# Patient Record
Sex: Female | Born: 1935 | Race: Black or African American | Hispanic: No | Smoking: Former smoker
Health system: Southern US, Community
[De-identification: ages and names within clinical notes are randomized; demographics above are authoritative.]

## PROBLEM LIST (undated history)

## (undated) DIAGNOSIS — I10 Essential (primary) hypertension: Secondary | ICD-10-CM

## (undated) DIAGNOSIS — M1990 Unspecified osteoarthritis, unspecified site: Secondary | ICD-10-CM

## (undated) DIAGNOSIS — J40 Bronchitis, not specified as acute or chronic: Secondary | ICD-10-CM

## (undated) DIAGNOSIS — E785 Hyperlipidemia, unspecified: Secondary | ICD-10-CM

## (undated) HISTORY — PX: CHOLECYSTECTOMY: SHX55

## (undated) HISTORY — PX: TOTAL KNEE ARTHROPLASTY: SHX125

---

## 1998-04-27 ENCOUNTER — Emergency Department (HOSPITAL_COMMUNITY): Admission: EM | Admit: 1998-04-27 | Discharge: 1998-04-27 | Payer: Self-pay | Admitting: Emergency Medicine

## 1998-05-11 ENCOUNTER — Other Ambulatory Visit: Admission: RE | Admit: 1998-05-11 | Discharge: 1998-05-11 | Payer: Self-pay | Admitting: Family Medicine

## 1999-04-07 ENCOUNTER — Other Ambulatory Visit: Admission: RE | Admit: 1999-04-07 | Discharge: 1999-04-07 | Payer: Self-pay | Admitting: Endocrinology

## 2000-11-24 ENCOUNTER — Encounter: Payer: Self-pay | Admitting: Emergency Medicine

## 2000-11-24 ENCOUNTER — Emergency Department (HOSPITAL_COMMUNITY): Admission: EM | Admit: 2000-11-24 | Discharge: 2000-11-24 | Payer: Self-pay | Admitting: Emergency Medicine

## 2000-11-26 ENCOUNTER — Emergency Department (HOSPITAL_COMMUNITY): Admission: EM | Admit: 2000-11-26 | Discharge: 2000-11-26 | Payer: Self-pay | Admitting: Emergency Medicine

## 2001-01-31 ENCOUNTER — Emergency Department (HOSPITAL_COMMUNITY): Admission: EM | Admit: 2001-01-31 | Discharge: 2001-01-31 | Payer: Self-pay | Admitting: Emergency Medicine

## 2001-01-31 ENCOUNTER — Encounter: Payer: Self-pay | Admitting: Emergency Medicine

## 2001-06-28 ENCOUNTER — Emergency Department (HOSPITAL_COMMUNITY): Admission: EM | Admit: 2001-06-28 | Discharge: 2001-06-29 | Payer: Self-pay | Admitting: Emergency Medicine

## 2001-06-29 ENCOUNTER — Encounter: Payer: Self-pay | Admitting: Emergency Medicine

## 2002-06-06 ENCOUNTER — Emergency Department (HOSPITAL_COMMUNITY): Admission: EM | Admit: 2002-06-06 | Discharge: 2002-06-06 | Payer: Self-pay | Admitting: Emergency Medicine

## 2002-06-06 ENCOUNTER — Encounter: Payer: Self-pay | Admitting: Emergency Medicine

## 2002-07-04 ENCOUNTER — Encounter: Payer: Self-pay | Admitting: General Surgery

## 2002-07-04 ENCOUNTER — Encounter (INDEPENDENT_AMBULATORY_CARE_PROVIDER_SITE_OTHER): Payer: Self-pay | Admitting: Specialist

## 2002-07-04 ENCOUNTER — Inpatient Hospital Stay (HOSPITAL_COMMUNITY): Admission: RE | Admit: 2002-07-04 | Discharge: 2002-07-06 | Payer: Self-pay | Admitting: General Surgery

## 2002-07-05 ENCOUNTER — Encounter: Payer: Self-pay | Admitting: *Deleted

## 2004-01-02 ENCOUNTER — Emergency Department (HOSPITAL_COMMUNITY): Admission: EM | Admit: 2004-01-02 | Discharge: 2004-01-03 | Payer: Self-pay | Admitting: Emergency Medicine

## 2004-11-22 ENCOUNTER — Ambulatory Visit (HOSPITAL_COMMUNITY): Admission: RE | Admit: 2004-11-22 | Discharge: 2004-11-22 | Payer: Self-pay | Admitting: Endocrinology

## 2006-06-05 ENCOUNTER — Encounter: Payer: Self-pay | Admitting: Vascular Surgery

## 2006-06-05 ENCOUNTER — Ambulatory Visit: Admission: RE | Admit: 2006-06-05 | Discharge: 2006-06-05 | Payer: Self-pay | Admitting: *Deleted

## 2006-06-26 ENCOUNTER — Encounter (INDEPENDENT_AMBULATORY_CARE_PROVIDER_SITE_OTHER): Payer: Self-pay | Admitting: *Deleted

## 2006-06-26 ENCOUNTER — Ambulatory Visit (HOSPITAL_COMMUNITY): Admission: RE | Admit: 2006-06-26 | Discharge: 2006-06-26 | Payer: Self-pay | Admitting: *Deleted

## 2008-09-13 ENCOUNTER — Emergency Department (HOSPITAL_COMMUNITY): Admission: EM | Admit: 2008-09-13 | Discharge: 2008-09-13 | Payer: Self-pay | Admitting: Emergency Medicine

## 2010-12-19 ENCOUNTER — Inpatient Hospital Stay (INDEPENDENT_AMBULATORY_CARE_PROVIDER_SITE_OTHER)
Admission: RE | Admit: 2010-12-19 | Discharge: 2010-12-19 | Disposition: A | Discharge status: Home / Self Care | Payer: Medicaid Other | Source: Ambulatory Visit | Attending: Family Medicine | Admitting: Family Medicine

## 2010-12-19 DIAGNOSIS — L0201 Cutaneous abscess of face: Secondary | ICD-10-CM

## 2010-12-19 DIAGNOSIS — K047 Periapical abscess without sinus: Secondary | ICD-10-CM

## 2011-03-17 NOTE — Discharge Summary (Signed)
   NAME:  Loretta Hartman, Loretta Hartman                         ACCOUNT NO.:  [id number]   MEDICAL RECORD NO.:  [id number]                   PATIENT TYPE:  INP   LOCATION:  0381                                 FACILITY:  [facility]   PHYSICIAN:  Adolph Pollack, M.D.            DATE OF BIRTH:  1932-04-08   DATE OF ADMISSION:  07/04/2002  DATE OF DISCHARGE:  07/06/2002                                 DISCHARGE SUMMARY   PRINCIPAL DISCHARGE DIAGNOSIS:  Chronic calculus cholecystitis.   SECONDARY DIAGNOSES:  1. Choledocholithiasis.  2. Hypertension.  3. Gastroesophageal reflux.  4. Hypercholesterolemia.  5. Arthritis.   PROCEDURE:  1. Laparoscopic cholecystectomy, intraoperative cholangiogram, and     transcystic balloon common bile duct exploration July 04, 2002.  2. Endoscopic retrograde cholangiopancreatography on July 05, 2002.   REASON FOR ADMISSION:  The patient is a 77 year old female with a biliary  colic seen in the emergency department and noted to have gallstones on an  ultrasound.  Because of her symptomatic gallstones, she is scheduled for an  elective cholecystectomy with cholangiogram as her liver functions are  slightly elevated.   HOSPITAL COURSE:  She underwent the laparoscopic cholecystectomy with  cholangiogram and is noted to have common bile duct obstruction and multiple  small stones were evacuated, but following that still could not get the  common bile duct to drain contrast into the duodenum.  Gastroenterology  consultation was obtained with Dr. Luther Parody.  He performed an ERCP.  __________ sphincterotomy and had minimal stone extractions done.  Post  procedure cholangiogram appeared free of defects.  She had initially bumped  her liver function tests right after operation but they improved after ERCP  and on July 06, 2002 she was felt to be adequate for discharge.    DISPOSITION:  Discharged to home July 06, 2002.  She will follow up with  me in two  weeks and was given discharge instructions and Vicodin for pain.  She is told to take all of her home medications.  She is discharged in  satisfactory condition.                                               Adolph Pollack, M.D.    Kari Baars  D:  07/15/2002  T:  07/15/2002  Job:  [id number]   cc:   Althea Grimmer. Luther Parody, M.D.  [address], [address]  [place]  [place] [address]  Fax: [phone]   Brooke Bonito, M.D.  [address]  [place]  [place] [address]  Fax: [phone]

## 2011-03-17 NOTE — Op Note (Signed)
Loretta Hartman, WHITEBREAD                        ACCOUNT NO.:  [id number]   MEDICAL RECORD NO.:  [id number]                   PATIENT TYPE:  OBV   LOCATION:  0381                                 FACILITY:  [facility]   PHYSICIAN:  Adolph Pollack, M.D.            DATE OF BIRTH:  February 07, 1932   DATE OF PROCEDURE:  07/04/2002  DATE OF DISCHARGE:                                 OPERATIVE REPORT   PREOPERATIVE DIAGNOSIS:  Symptomatic cholelithiasis.   POSTOPERATIVE DIAGNOSES:  1. Symptomatic cholelithiasis.  2. Choledocholithiasis with common bile duct obstruction.   PROCEDURES:  1. Laparoscopic cholecystectomy with intraoperative cholangiogram.  2. Transecting balloon common bile duct exploration.   SURGEON:  Adolph Pollack, M.D.   ASSISTANT:  Gita Kudo, M.D.   ANESTHESIA:  General.   FINDINGS:  The common bile duct appeared slightly dilated as did the cystic  duct.  On cholangiogram, there is no drainage of contrast into the duodenum,  and there appeared to be filling defects within the common bile duct as well  as the common hepatic duct.  I did a transvestic common bile duct  exploration using a small balloon and evacuated 24 small, black stones.  However, on repeat cholangiogram, demonstrated still obstruction of common  bile duct with filling defect distally as well as some question of hepatic  duct filling defect or enhancement.   INDICATION:  The patient is a 77 year old female, who had a severe attack of  biliary colic, leading her to go to the emergency department in early  August.  Ever since that time, she has watched her diet and has not had any  more severe attacks.  She has had minor episode like the one she had that  lead her to go to the emergency department.  She does have a mild elevation  of her SGOT and alkaline phosphatase with her alkaline phosphatase being 164  (high-normal 117) and SGOT being 74 (high-normal 37).  She now presents for  elective  cholecystectomy.   TECHNIQUE:  She was placed supine on the operating table, and a general  anesthetic was administered.  Her abdomen was sterilely prepped and draped.  Local anesthetic was infiltrated in the supraumbilical region, and a small  supraumbilical incision was made, incising the skin and subcutaneous tissue  sharply.  Using blunt dissection, the midline fascia was  identified and a  small incision made in it.  The peritoneal cavity was then entered bluntly  and under direct vision.  A pursestring suture of 0 Vicryl was placed around  the fascial edges.  A Hasson trocar was introduced into the peritoneal  cavity, and a pneumoperitoneum was created by insufflation of CO2 gas.   The laparoscope was then introduced, and the patient was placed in the  appropriate position.  Omental adhesions were noted to the lower abdominal  wall.  Under direct vision, an 11 mm trocar was placed through an  epigastric  incision, and two 5 mm trocars were placed in right mid abdomen.  Using  sharp dissection, I was able to take of the omental adhesions down in the  Hasson trocar area to clearly expose the trocar.  No bile was in the area,  and no bowel entry was noted.   Next, the fundus of the gallbladder was grasped, and the gallbladder had  some chronic inflammatory changes.  The fundus was retracted toward the  right shoulder, and the infundibulum was retracted laterally.  The common  bile duct was prominent.  Staying on the gallbladder, we used blunt  dissection to completely mobilize the infundibulum and identify the cystic  duct and the gallbladder junction.  I created a window around this.  I then  identified an anterior and posterior branch of the cystic artery.  I clipped  and divided the anterior branch and clipped and did not divide at that time  the posterior branch.  I placed a clip at the gallbladder cystic duct  junction and made an incision in the cystic duct.  Immediately upon  entering  the cystic duct, bile was evacuated under pressure along with small, black  stones.  I subsequently placed a cholangiocatheter through the anterior  abdominal wall into the cystic duct, and a cholangiogram was performed.   Using real-time fluoroscopy, dilute contrast was injected into the cystic  duct.  The cystic duct was long.  The common bile duct did not drain and had  some filling defects in it.  The common hepatic duct also had suggestion of  filling defects.  I subsequently had the anesthesiology staff give her 1 mg  of IV Glucagon.  I then used a soft balloon and passed it into the common  bile duct and made multiple sweeps, evacuating a total of 24 small, black  stones.  After I had done this, I repeated the cholangiogram.  Although the  common bile duct was patent for a slightly longer distance, it was still  obstructed by what appeared to be stones.  I decided at this point that I  would not be able to evacuate all the stones in this fashion, and I felt  that ERCP would be the best way to go.   I subsequently removed the cholangiocatheter.  I clipped the cystic duct  four times proximally then divided it.  I then divided the posterior branch  of the cystic artery and dissected the gallbladder free from the liver bed  intact and placed it an Endopouch bag.  I then irrigated out the gallbladder fossa and bleeding was controlled with  the cautery.  I inspected it multiple then placed Surgicel against the raw  surface of the liver and inspected it multiple times after this and noted no  bleeding or bile leakage.  I then evacuated as many stones as possible and  evacuated as much fluid as possible I could from the perihepatic area.   The gallbladder was then removed through the supraumbilical port, and the  fascial defect in the supraumbilical region was closed by tightening up and tying down the pursestring suture.  The remaining trocars were removed,a dn  the  pneumoperitoneum released.  The skin incisions were closed with 4-0  Monocryl subcuticular stitches.  Steri-Strips and sterile dressings were  applied.   She tolerated the procedure well without any apparent complications and was  taken to the recovery room in satisfactory condition.  I contacted Dr. Roosvelt Harps, who will  see her in consultation regarding ERCP.                                               Adolph Pollack, M.D.    Kari Baars  D:  07/04/2002  T:  07/04/2002  Job:  [id number]   cc:   Brooke Bonito, M.D.  [address]  [place]  [place] [address]  Fax: [phone]   Althea Grimmer. Luther Parody, M.D.  [address]  [place]  [place] [address]  Fax: [phone]

## 2011-03-17 NOTE — Consult Note (Signed)
NAME:  Loretta Hartman, Loretta Hartman                         ACCOUNT NO.:  [id number]   MEDICAL RECORD NO.:  [id number]                   PATIENT TYPE:  OBV   LOCATION:  0381                                 FACILITY:  [facility]   PHYSICIAN:  Althea Grimmer. Luther Parody, M.D.            DATE OF BIRTH:  1932/01/21   DATE OF CONSULTATION:  07/04/2002  DATE OF DISCHARGE:                                   CONSULTATION   REASON FOR CONSULTATION:  The patient is a 77 year old female whom I am  asked to see by Dr.  Abbey Chatters for a probable choledocholithiasis with  obstruction.  She presented to the emergency room in mid-August with crampy  right upper quadrant pain radiating to her back.  An ultrasonogram at that  time revealed cholelithiasis with a normal common bile duct.  Alkaline  phosphatase was 164, SGOT 74, total bilirubin and lipase were normal.  She  had had prior similar episodes of discomfort.  She was scheduled for an  elective laparoscopic cholecystectomy which was carried out this morning.  At intraoperative cholangiogram multiple small common bile duct stones were  seen and reportedly 24 were extracted, but there was no filling of the  distal common bile duct or drainage of dye into the duodenum.  Postoperatively, the patient is doing well.  She says pain is absolutely  minimal.   PAST MEDICAL HISTORY:  Included hypertension, hypercholesterolemia, reflux  disease and osteoarthritis.   PAST SURGICAL HISTORY:  Bilateral knee replacement and tubal ligation.   CURRENT MEDICATIONS:  Verapamil and aspirin as an outpatient.   ALLERGIES:  PENICILLIN.   FAMILY HISTORY:  Noncontributory although it is noted that she had a brother  with colon cancer.   SOCIAL HISTORY:  Nonsmoker, nondrinker.  Has a very supportive family  present in the room today.   REVIEW OF SYSTEMS:  GENERAL:  No weight loss or night sweats.  ENDOCRINE:  No history of diabetes or thyroid problems.  SKIN:  No rash or pruritus.  EYES:  No icterus or change in vision.  ENT:  No aphthous ulcers or chronic  sore throat.  RESPIRATORY:  No shortness of breath, cough, or wheezing.  CARDIAC:  No chest pain, palpitations, or history of valvular heart disease.  GI:  As above.  GU:  No dysuria or hematuria.  Remainder of review of  systems is negative.   PHYSICAL EXAMINATION:  GENERAL:  She is a well-developed obese adult female  in no acute distress.  VITAL SIGNS:  Afebrile and normotensive.  SKIN:  Normal.  HEENT:  Eyes are anicteric.  Oropharynx unremarkable.  CHEST:  Clear.  HEART:  Regular rate and rhythm.  ABDOMEN:  Obese and minimally distended without tenderness.  Laparoscopy  sites are clean and dry.  She has hypoactive bowel sounds.  RECTAL:  Not performed.  EXTREMITIES:  Without clubbing, cyanosis, edema or rash.   IMPRESSION:  A 77 year old female status  post laparoscopic cholecystectomy  with documented choledocholithiasis.  Some stones have been removed but  there remains a probable distal common bile duct obstruction.   PLAN:  ERCP with spincterotomy and stone extraction is indicated and advised  to the patient.  The procedure is reviewed with the patient and her family  in terms of technique, preparation and risks of complications including  bleeding, perforation, and pancreatitis.  They agree to proceed and it will  be performed in the morning.  Further recommendations to follow that  procedure.                                               Althea Grimmer. Luther Parody, M.D.    PJS/MEDQ  D:  07/04/2002  T:  07/04/2002  Job:  [id number]   cc:   Adolph Pollack, M.D.  Fax: [phone]

## 2011-03-17 NOTE — Op Note (Signed)
NAMEEUN, VERMEER               ACCOUNT NO.:  [id number]   MEDICAL RECORD NO.:  [id number]          PATIENT TYPE:  AMB   LOCATION:  ENDO                         FACILITY:  [facility]   PHYSICIAN:  Georgiana Spinner, M.D.    DATE OF BIRTH:  July 23, 1932   DATE OF PROCEDURE:  06/26/2006  DATE OF DISCHARGE:                                 OPERATIVE REPORT   PROCEDURE:  Colonoscopy.   INDICATIONS:  Colon cancer screening.   ANESTHESIA:  Demerol 50 mg, Versed 5 mg.   PROCEDURE:  With the patient mildly sedated in the left lateral decubitus  position the Olympus videoscopic colonoscope was inserted into the rectum  and passed under direct vision to the cecum identified by appendiceal  orifice and ileocecal valve both of which were photographed. From this point  the colonoscope was slowly withdrawn taking circumferential views of colonic  mucosa, stopping only in the rectum which appeared normal on direct and  showed hemorrhoids on retroflexed view other than area in the cecum which  appeared to possibly have changes of colitis that were photographed and  biopsied. The patient's vital signs, pulse oximeter remained stable.  The  patient tolerated procedure well without apparent complications.   FINDINGS:  Changes of the cecal possible colitis.  Await biopsy report.  The  patient will call me for results and follow-up with me as an outpatient.           ______________________________  Georgiana Spinner, M.D.     GMO/MEDQ  D:  06/26/2006  T:  06/26/2006  Job:  [id number]

## 2011-03-17 NOTE — Op Note (Signed)
Loretta Hartman, Loretta Hartman                        ACCOUNT NO.:  [id number]   MEDICAL RECORD NO.:  [id number]                   PATIENT TYPE:  OBV   LOCATION:  0381                                 [facility]ACILITY:  [facility]   PHYSICIAN:  Althea Grimmer. Luther Parody, M.D.            DATE OF BIRTH:  Jul 28, 1932   DATE OF PROCEDURE:  07/05/2002  DATE OF DISCHARGE:                                 OPERATIVE REPORT   PROCEDURE:  Endoscopic retrograde cholangiopancreatography with  sphincterotomy and stone extraction.   INDICATIONS FOR PROCEDURE:  This 77 year old female who had laparoscopic  cholecystectomy with cholangiogram yesterday and retrieval of multiple  common bile duct stones.  She has abnormal liver function tests.  The  cholangiogram suggested obstruction of the distal common bile duct.   PREPARATION:  She is NPO and has been on ciprofloxacin IV.   PREPROCEDURE SEDATION:  Prior to and during the procedure she received 110  mg of Demerol, 9 mg of Versed, and 0.25 mg of Glucagon IV.  In addition, her  throat was anesthetized with Cetacaine spray and she was on 2 liters of  nasal oxygen.   PROCEDURE:  The Olympus video duodenoscope was inserted by the mouth and  advanced fairly easily into the duodenum.  The ampulla was visualized and  was somewhat protuberant but the orifice was very small.  It was extremely  difficult to cannulate and multiple attempts had to be made and the patient  had to be repositioned several times.  A few brief injections of the  pancreatic duct were made.  However, the common bile duct was eventually  cannulated.  A guidewire was passed to the bifurcation and injection  revealed no obvious stones.  A moderate-sized sphincterotomy was performed  due to the prior history.  Balloon catheter exchange with an 8-mm balloon  was made and this was inflated at the bifurcation and dragged through the  common bile duct and delivered easily into the duodenum with minimal stone  debris  seen.  Postprocedure cholangiogram did not clear initial any residual  filling defects.  The patient tolerated the procedure well.  Post blood  pressure and oximetry testing were stable throughout.  She will be observed  in recovery and if stable, sent to the floor within one hour.   IMPRESSION:  Cholelithiasis, status post laparoscopic cholecystectomy with  abnormal cholangiogram now status post endoscopic retrograde  cholangiopancreatography with sphincterotomy and minimal stone extraction.   PLAN:  The patient will be observed in hospital today, liver function tests  checked tomorrow.  She will be on clear liquids and diet advanced if doing  well.                                                Althea Grimmer. Luther Parody, M.D.  PJS/MEDQ  D:  07/05/2002  T:  07/05/2002  Job:  [id number]   cc:   Adolph Pollack, M.D.  Fax: [phone]

## 2014-06-06 ENCOUNTER — Encounter (HOSPITAL_COMMUNITY): Payer: Self-pay | Admitting: Emergency Medicine

## 2014-06-06 ENCOUNTER — Emergency Department (HOSPITAL_COMMUNITY)
Admission: EM | Admit: 2014-06-06 | Discharge: 2014-06-06 | Disposition: A | Discharge status: Home / Self Care | Payer: PRIVATE HEALTH INSURANCE | Attending: Emergency Medicine | Admitting: Emergency Medicine

## 2014-06-06 DIAGNOSIS — L03119 Cellulitis of unspecified part of limb: Secondary | ICD-10-CM | POA: Diagnosis not present

## 2014-06-06 DIAGNOSIS — M79609 Pain in unspecified limb: Secondary | ICD-10-CM

## 2014-06-06 DIAGNOSIS — L02419 Cutaneous abscess of limb, unspecified: Secondary | ICD-10-CM | POA: Diagnosis not present

## 2014-06-06 DIAGNOSIS — L03116 Cellulitis of left lower limb: Secondary | ICD-10-CM

## 2014-06-06 DIAGNOSIS — M7989 Other specified soft tissue disorders: Secondary | ICD-10-CM | POA: Diagnosis present

## 2014-06-06 HISTORY — DX: Essential (primary) hypertension: I10

## 2014-06-06 HISTORY — DX: Hyperlipidemia, unspecified: E78.5

## 2014-06-06 HISTORY — DX: Bronchitis, not specified as acute or chronic: J40

## 2014-06-06 NOTE — Progress Notes (Signed)
VASCULAR LAB PRELIMINARY  PRELIMINARY  PRELIMINARY  PRELIMINARY  Left lower extremity venous Doppler completed.    Preliminary report:  There is no DVT or SVT noted in the left lower extremity. Enlarged lymph node noted in the right groin.  Federick Levene, RVT 06/06/2014, 2:12 PM

## 2014-06-06 NOTE — ED Notes (Signed)
L/lowetr leg swollen, red and warm. Denies tenderness in calf. Pt reports swelling and itching to l/lower leg x 4 days. Seen by Dr. Juleen ChinaKohut on Thursday. Placed on doxycycline and Bactrim.

## 2014-06-06 NOTE — ED Provider Notes (Signed)
CSN: [id number]     Arrival date & time 06/06/14  4098 History   First MD Initiated Contact with Patient 06/06/14 1005     Chief Complaint  Patient presents with  . Leg Swelling    4 day hx of swelling and itching to l/lower leg     (Consider location/radiation/quality/duration/timing/severity/associated sxs/prior Treatment) HPI  81yF with atraumatic LLE swelling/redness/pain. Gradual onset about 4d ago. Seen by PCP and started on bactrim/doxy. Told that needed re-evaluation if symptoms not better which is why she is presenting today. Redness perhaps has mildly improved by swelling has not. Does have some pain, but more of an annoyance and it itches. No numbness or tingling. No fever or chills. Reports distant hx of "blood clot" but unable to tell me specifics. Not anticoagulated. No CP or SOB.    History reviewed. No pertinent past medical history. History reviewed. No pertinent past surgical history. No family history on file. History  Substance Use Topics  . Smoking status: Not on file  . Smokeless tobacco: Not on file  . Alcohol Use: Not on file   OB History   Grav Para Term Preterm Abortions TAB SAB Ect Mult Living                 Review of Systems  All systems reviewed and negative, other than as noted in HPI.   Allergies  Review of patient's allergies indicates not on file.  Home Medications   Prior to Admission medications   Not on File   BP 115/46  Pulse 72  Temp(Src) 98.1 F (36.7 C) (Oral)  Resp 20  Wt 199 lb (90.266 kg)  SpO2 97% Physical Exam  Nursing note and vitals reviewed. Constitutional: She appears well-developed and well-nourished. No distress.  HENT:  Head: Normocephalic and atraumatic.  Eyes: Conjunctivae are normal. Right eye exhibits no discharge. Left eye exhibits no discharge.  Neck: Neck supple.  Cardiovascular: Normal rate, regular rhythm and normal heart sounds.  Exam reveals no gallop and no friction rub.   No murmur  heard. Pulmonary/Chest: Effort normal and breath sounds normal. No respiratory distress.  Abdominal: Soft. She exhibits no distension. There is no tenderness.  Musculoskeletal: She exhibits no edema and no tenderness.  Neurological: She is alert.  Skin: Skin is warm.     Well healed incisions from b/l knee replacements. Swelling of LLE from just below knee to the foot. Erythema and increased warmth from just above ankle to just below knee in depicted area. This is circumferential. No area concerning for abscess. No obvious break in skin. Palpable DP pulse. Sensation intact to light touch. Can actively range ankle and knee with little/no apparent discomfort.   Psychiatric: She has a normal mood and affect. Her behavior is normal. Thought content normal.    ED Course  Procedures (including critical care time) Labs Review Labs Reviewed - No data to display  Imaging Review No results found.   EKG Interpretation None      MDM   Final diagnoses:  Cellulitis of left lower extremity    81yF with atraumatic LLE swelling/erythema. Being treated as cellulitis. ~2 days of bactrim/doxycycline w/o significant mprovement. Concern for possible DVT. Pt reports hx of "blood clot in my leg" in 1990s. Unsure of specifics though in terms of exact etiology though.   [place] negative. Discussed with pt continuing abx as she has been on for only two days versus admission. She would like to go home. I think this is reasonable.  Afebrile. No systemic symptoms. No hx of diabetes or other immunocompromising factors. Has follow-up appointment with PCP on Monday. Return precautions discussed.     Raeford RazorStephen Wilbern Pennypacker, MD 06/08/14 [phone]

## 2014-06-06 NOTE — Discharge Instructions (Signed)

## 2014-06-08 ENCOUNTER — Encounter (HOSPITAL_COMMUNITY): Payer: Self-pay | Admitting: Emergency Medicine

## 2014-06-08 ENCOUNTER — Inpatient Hospital Stay (HOSPITAL_COMMUNITY)
Admission: EM | Admit: 2014-06-08 | Discharge: 2014-06-11 | DRG: 603 | Disposition: A | Discharge status: Home / Self Care | Payer: PRIVATE HEALTH INSURANCE | Attending: Internal Medicine | Admitting: Internal Medicine

## 2014-06-08 DIAGNOSIS — E872 Acidosis, unspecified: Secondary | ICD-10-CM | POA: Diagnosis present

## 2014-06-08 DIAGNOSIS — N183 Chronic kidney disease, stage 3 unspecified: Secondary | ICD-10-CM | POA: Diagnosis present

## 2014-06-08 DIAGNOSIS — E8729 Other acidosis: Secondary | ICD-10-CM | POA: Diagnosis present

## 2014-06-08 DIAGNOSIS — Z880 Allergy status to penicillin: Secondary | ICD-10-CM | POA: Diagnosis not present

## 2014-06-08 DIAGNOSIS — Z86718 Personal history of other venous thrombosis and embolism: Secondary | ICD-10-CM

## 2014-06-08 DIAGNOSIS — Y9210 Unspecified residential institution as the place of occurrence of the external cause: Secondary | ICD-10-CM | POA: Diagnosis not present

## 2014-06-08 DIAGNOSIS — Z87891 Personal history of nicotine dependence: Secondary | ICD-10-CM | POA: Diagnosis not present

## 2014-06-08 DIAGNOSIS — N179 Acute kidney failure, unspecified: Secondary | ICD-10-CM | POA: Diagnosis present

## 2014-06-08 DIAGNOSIS — E785 Hyperlipidemia, unspecified: Secondary | ICD-10-CM | POA: Diagnosis present

## 2014-06-08 DIAGNOSIS — T368X5A Adverse effect of other systemic antibiotics, initial encounter: Secondary | ICD-10-CM | POA: Diagnosis not present

## 2014-06-08 DIAGNOSIS — L270 Generalized skin eruption due to drugs and medicaments taken internally: Secondary | ICD-10-CM | POA: Diagnosis not present

## 2014-06-08 DIAGNOSIS — I10 Essential (primary) hypertension: Secondary | ICD-10-CM | POA: Diagnosis present

## 2014-06-08 DIAGNOSIS — I129 Hypertensive chronic kidney disease with stage 1 through stage 4 chronic kidney disease, or unspecified chronic kidney disease: Secondary | ICD-10-CM | POA: Diagnosis present

## 2014-06-08 DIAGNOSIS — Z96659 Presence of unspecified artificial knee joint: Secondary | ICD-10-CM | POA: Diagnosis not present

## 2014-06-08 DIAGNOSIS — L03119 Cellulitis of unspecified part of limb: Principal | ICD-10-CM

## 2014-06-08 DIAGNOSIS — Z6839 Body mass index (BMI) 39.0-39.9, adult: Secondary | ICD-10-CM

## 2014-06-08 DIAGNOSIS — Z7982 Long term (current) use of aspirin: Secondary | ICD-10-CM

## 2014-06-08 DIAGNOSIS — R21 Rash and other nonspecific skin eruption: Secondary | ICD-10-CM | POA: Diagnosis not present

## 2014-06-08 DIAGNOSIS — N189 Chronic kidney disease, unspecified: Secondary | ICD-10-CM

## 2014-06-08 DIAGNOSIS — N289 Disorder of kidney and ureter, unspecified: Secondary | ICD-10-CM

## 2014-06-08 DIAGNOSIS — L02419 Cutaneous abscess of limb, unspecified: Principal | ICD-10-CM | POA: Diagnosis present

## 2014-06-08 DIAGNOSIS — L03116 Cellulitis of left lower limb: Secondary | ICD-10-CM | POA: Diagnosis present

## 2014-06-08 LAB — CBC WITH DIFFERENTIAL/PLATELET
Basophils Absolute: 0 10*3/uL (ref 0.0–0.1)
Basophils Relative: 0 % (ref 0–1)
Eosinophils Absolute: 0.5 10*3/uL (ref 0.0–0.7)
Eosinophils Relative: 10 % — ABNORMAL HIGH (ref 0–5)
HCT: 35.1 % — ABNORMAL LOW (ref 36.0–46.0)
Hemoglobin: 11.6 g/dL — ABNORMAL LOW (ref 12.0–15.0)
Lymphocytes Relative: 10 % — ABNORMAL LOW (ref 12–46)
Lymphs Abs: 0.5 10*3/uL — ABNORMAL LOW (ref 0.7–4.0)
MCH: 30.4 pg (ref 26.0–34.0)
MCHC: 33 g/dL (ref 30.0–36.0)
MCV: 92.1 fL (ref 78.0–100.0)
Monocytes Absolute: 0.4 10*3/uL (ref 0.1–1.0)
Monocytes Relative: 7 % (ref 3–12)
Neutro Abs: 3.7 10*3/uL (ref 1.7–7.7)
Neutrophils Relative %: 73 % (ref 43–77)
Platelets: 207 10*3/uL (ref 150–400)
RBC: 3.81 MIL/uL — ABNORMAL LOW (ref 3.87–5.11)
RDW: 13.7 % (ref 11.5–15.5)
WBC: 5.1 10*3/uL (ref 4.0–10.5)

## 2014-06-08 LAB — BASIC METABOLIC PANEL WITH GFR
Anion gap: 17 — ABNORMAL HIGH (ref 5–15)
BUN: 47 mg/dL — ABNORMAL HIGH (ref 6–23)
CO2: 22 meq/L (ref 19–32)
Calcium: 9.6 mg/dL (ref 8.4–10.5)
Chloride: 100 meq/L (ref 96–112)
Creatinine, Ser: 2.33 mg/dL — ABNORMAL HIGH (ref 0.50–1.10)
GFR calc Af Amer: 22 mL/min — ABNORMAL LOW (ref >90)
GFR calc non Af Amer: 19 mL/min — ABNORMAL LOW (ref >90)
Glucose, Bld: 92 mg/dL (ref 70–99)
Potassium: 3.9 meq/L (ref 3.7–5.3)
Sodium: 139 meq/L (ref 137–147)

## 2014-06-08 LAB — PRO B NATRIURETIC PEPTIDE: Pro B Natriuretic peptide (BNP): 453.9 pg/mL — ABNORMAL HIGH (ref 0–450)

## 2014-06-08 MED ORDER — SODIUM CHLORIDE 0.9 % IV SOLN: INTRAVENOUS | Status: DC

## 2014-06-08 MED ORDER — CLINDAMYCIN PHOSPHATE 600 MG/50ML IV SOLN
600.0000 mg | Freq: Three times a day (TID) | INTRAVENOUS | Status: DC
  Administered 2014-06-08 – 2014-06-09 (×4): 600 mg via INTRAVENOUS
  Filled 2014-06-08 (×5): qty 50

## 2014-06-08 MED ORDER — SODIUM CHLORIDE 0.9 % IV SOLN
INTRAVENOUS | Status: DC
  Administered 2014-06-08: 19:00:00 via INTRAVENOUS

## 2014-06-08 MED ORDER — VANCOMYCIN HCL IN DEXTROSE 1-5 GM/200ML-% IV SOLN: 1000.0000 mg | Freq: Once | INTRAVENOUS | Status: DC

## 2014-06-08 MED ORDER — VANCOMYCIN HCL 10 G IV SOLR
1500.0000 mg | INTRAVENOUS | Status: AC
  Administered 2014-06-08: 1500 mg via INTRAVENOUS
  Filled 2014-06-08: qty 1500

## 2014-06-08 MED ORDER — SODIUM CHLORIDE 0.9 % IV BOLUS (SEPSIS)
500.0000 mL | Freq: Once | INTRAVENOUS | Status: AC
  Administered 2014-06-08: 500 mL via INTRAVENOUS

## 2014-06-08 MED ORDER — VANCOMYCIN HCL IN DEXTROSE 750-5 MG/150ML-% IV SOLN
750.0000 mg | INTRAVENOUS | Status: DC
  Filled 2014-06-08: qty 150

## 2014-06-08 MED ORDER — HEPARIN SODIUM (PORCINE) 5000 UNIT/ML IJ SOLN
5000.0000 [IU] | Freq: Three times a day (TID) | INTRAMUSCULAR | Status: DC
  Administered 2014-06-08 – 2014-06-11 (×8): 5000 [IU] via SUBCUTANEOUS
  Filled 2014-06-08 (×11): qty 1

## 2014-06-08 NOTE — H&P (Signed)
[facility] History and Physical  Loretta Hartman XBJ:[id number] DOB: 04/07/1932 DOA: 06/08/2014  Referring physician: ED PCP: [name],[name], MD  Specialists: None  Chief Complaint: Swelling  HPI: Loretta Hartman is a 80 y.o. female relatively healthy female known history hypertension, hyperlipidemia, obesity, status post bilateral TKR [facility]14 with left lower extremity swelling tenderness pain. She was placed on Bactrim and doxycycline on 06/02/14. She returned home and she did not want to stay in the hospital. She had a Doppler ultrasound of the lower extremities well which proved negative for DVT Subsequently came back 06/08/2014 with worsening of the swelling, inability to bear weight on the left leg. She states she's been taking antibiotics as directed She has no constitutional fever chills nausea vomiting or systemic indices of infection. Denies cough cold or other problems. Emergency room workup = WBC 5.1, hemoglobin 6, hematocrit 35 BUN 47, creatinine 2.33, anion gap 17. 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  Review of Systems: See above  Past Medical History  Diagnosis Date  . Bronchitis   . Hypertension   . Hyperlipidemia    Past Surgical History  Procedure Laterality Date  . Total knee arthroplasty      bilateral  . Cholecystectomy     Social History:  History   Social History Narrative  . No narrative on file    Allergies  Allergen Reactions  . Penicillins Anaphylaxis    Family History  Problem Relation Age of Onset  . Cancer Father   . Cancer Brother   . Diabetes Brother     Prior to Admission medications   Medication Sig Start Date End Date Taking? Authorizing Provider  aspirin EC 81  MG tablet Take 81 mg by mouth daily.   Yes Historical Provider, MD  doxycycline (VIBRA-TABS) 100 MG tablet Take 100 mg by mouth 2 (two) times daily.   Yes Historical Provider, MD  furosemide (LASIX) 40 MG tablet Take 40 mg by mouth daily as needed for fluid.    Yes Historical Provider, MD  lisinopril-hydrochlorothiazide (PRINZIDE,ZESTORETIC) 20-25 MG per tablet Take 1 tablet by mouth daily.   Yes Historical Provider, MD  Multiple Vitamin (MULTIVITAMIN WITH MINERALS) TABS tablet Take 1 tablet by mouth daily.   Yes Historical Provider, MD  naproxen sodium (ANAPROX) 220 MG tablet Take 440 mg by mouth 2 (two) times daily with a meal.   Yes Historical Provider, MD  Omega-3 Fatty Acids (OMEGA 3 PO) Take 1 capsule by mouth daily.   Yes Historical Provider, MD  rosuvastatin (CRESTOR) 10 MG tablet Take 10 mg by mouth at bedtime.    Yes Historical Provider, MD  sulfamethoxazole-trimethoprim (BACTRIM DS) 800-160 MG per tablet Take 1 tablet by mouth 2 (two) times daily.   Yes Historical Provider, MD   Physical Exam: Filed Vitals:   06/08/14 1413 06/08/14 1526 06/08/14 1538 06/08/14 1629  BP: 108/54 126/67  125/65  Pulse: 74 66  62  Temp: 98.1 F (36.7 C) 98 F (36.7 C)    TempSrc: Oral Oral    Resp: 20 20  20   Height:   5' (1.524 m)   Weight:   90.719 kg (200 lb)   SpO2: 100% 95%  97%  General:  EOMI, NCAT,Morbid obesity, Body mass index is 39.06 kg/(m^2).  Eyes:  no pallor no rest  ENT:  Mallampati 1, moderate dentition, no care is  Neck:  no JVD  Cardiovascular:  S1-S2? Murmur left upper sternal edge 2/6  Respiratory:  clinically clear no added sound  Abdomen:  obese nontender nondistended  Skin:   Musculoskeletal:  range of motion intact  Psychiatric:  euthymic pleasant  Neurologic:  reflexes 2/3, power 5/5 bilaterally gait not assessed 3 intact, EOMI  Labs on Admission:  Basic Metabolic Panel:  Recent Labs Lab 06/08/14 1537  NA 139  K 3.9  CL 100  CO2 22  GLUCOSE  92  BUN 47*  CREATININE 2.33*  CALCIUM 9.6   Liver Function Tests: No results found for this basename: AST, ALT, ALKPHOS, BILITOT, PROT, ALBUMIN,  in the last 168 hours No results found for this basename: LIPASE, AMYLASE,  in the last 168 hours No results found for this basename: AMMONIA,  in the last 168 hours CBC:  Recent Labs Lab 06/08/14 1537  WBC 5.1  NEUTROABS 3.7  HGB 11.6*  HCT 35.1*  MCV 92.1  PLT 207   Cardiac Enzymes: No results found for this basename: CKTOTAL, CKMB, CKMBINDEX, TROPONINI,  in the last 168 hours  BNP (last 3 results) No results found for this basename: PROBNP,  in the last 8760 hours CBG: No results found for this basename: GLUCAP,  in the last 168 hours  Radiological Exams on Admission: No results found.  EKG: Independently reviewed.  none performed  Assessment/Plan Active Problems:   Cellulitis of left lower extremity-failed outpatient management-plus size mild to moderate disease only given white count below 12 no fever no tachypnea no tachycardia agree with empiric coverage vancomycin. She is now taken 4 days of antibiotics total.  Strep coverage added-clindamycin 600 3 times a day-we can potentially narrative this if she does improve. We will get a CBC in the morning. I have asked nursing to marked out the area with pen   Hypertension goal BP (blood pressure) < 130/80-she's on numerous antihypertensive medications Note that patient is on Naprosyn, HCTZ ACE inhibitor and Lasix, all of which will be discontinued.  She will need a different medication control her pressure, probably amlodipine 10 mg which I will start today. If this does not improve we can give her a beta blocker as well   Metabolic acidosis, increased anion gap (IAG)-unclear etiology.   Chronic kidney disease (CKD), stage III (moderate)   Hyperlipidemia   ? CHF-and BNP to labs. May benefit from outpatient echocardiogram   Time spent: 25 No family at bedside Inpatient,  MedSurg 340  Mahala Menghini [facility] Pager [phone]  If 7PM-7AM, please contact night-coverage [email or web address] Password [email or web address] 06/08/2014, 5:00 PM

## 2014-06-08 NOTE — ED Notes (Signed)
Pt was given a [place] sandwhich @ 16:30

## 2014-06-08 NOTE — Progress Notes (Signed)
ANTIBIOTIC CONSULT NOTE - INITIAL  Pharmacy Consult for vancomycin Indication: cellulitis  Allergies  Allergen Reactions  . Penicillins Anaphylaxis    Patient Measurements: Height: 5' (152.4 cm) Weight: 200 lb (90.719 kg) IBW/kg (Calculated) : 45.5 Adjusted Body Weight:   Vital Signs: Temp: 98 F (36.7 C) (08/10 1526) Temp src: Oral (08/10 1526) BP: 126/67 mmHg (08/10 1526) Pulse Rate: 66 (08/10 1526) Intake/Output from previous day:   Intake/Output from this shift:    Labs: No results found for this basename: WBC, HGB, PLT, LABCREA, CREATININE,  in the last 72 hours CrCl is unknown because no creatinine reading has been taken. No results found for this basename: VANCOTROUGH, VANCOPEAK, VANCORANDOM, GENTTROUGH, GENTPEAK, GENTRANDOM, TOBRATROUGH, TOBRAPEAK, TOBRARND, AMIKACINPEAK, AMIKACINTROU, AMIKACIN,  in the last 72 hours   Microbiology: No results found for this or any previous visit (from the past 720 hour(s)).  Medical History: Past Medical History  Diagnosis Date  . Bronchitis   . Hypertension   . Hyperlipidemia    Assessment: [age] YOF presents with L LE leg swelling and cellulitis.  LE dopplers negative for DVT.  Started on doxycycline and TMP/SMZ as outpatient (hole = Grp A strep).  8/10 >>vancomycin >>  Tmax: afeb WBCs: WNL Renal: SCr elevated, normalized CrCl = 4522ml/min  NO cultures  Drug level / dose changes info:  Goal of Therapy:  Vancomycin trough level 10-15 mcg/ml  Plan:   Vancomycin 1500mg  IV x 1 then 750mg  IV q24h  Follow-up renal function and adjust dose as changes  Check vancomycin trough at steady state  Juliette Alcideustin Zeigler, PharmD, BCPS.   Pager: [phone]  06/08/2014,3:57 PM

## 2014-06-08 NOTE — ED Provider Notes (Signed)
CSN: [id number]     Arrival date & time 06/08/14  1352 History   First MD Initiated Contact with Patient 06/08/14 1458     No chief complaint on file.    (Consider location/radiation/quality/duration/timing/severity/associated sxs/prior Treatment) HPI  81yF with LLE erythema/swelling. Actually seen by myself two days ago. Symptoms not improved. Seen by PCP today and referred back to ED. Her exam today is essentially the same as when I last saw her. She continues to deny fever/chills. Complaining of more an itching sensation at rest, but is painful when she bears weight. US on last visit negative for DVT. Has been on doxy/bactrim for four days now.   Past Medical History  Diagnosis Date  . Bronchitis   . Hypertension   . Hyperlipidemia    Past Surgical History  Procedure Laterality Date  . Total knee arthroplasty      bilateral  . Cholecystectomy     Family History  Problem Relation Age of Onset  . Cancer Father   . Cancer Brother   . Diabetes Brother    History  Substance Use Topics  . Smoking status: Former Games developermoker  . Smokeless tobacco: Not on file  . Alcohol Use: No   OB History   Grav Para Term Preterm Abortions TAB SAB Ect Mult Living                 Review of Systems  All systems reviewed and negative, other than as noted in HPI.   Allergies  Penicillins  Home Medications   Prior to Admission medications   Medication Sig Start Date End Date Taking? Authorizing Provider  aspirin EC 81 MG tablet Take 81 mg by mouth daily.    Historical Provider, MD  doxycycline (VIBRA-TABS) 100 MG tablet Take 100 mg by mouth 2 (two) times daily.    Historical Provider, MD  furosemide (LASIX) 40 MG tablet Take 40 mg by mouth daily.    Historical Provider, MD  lisinopril-hydrochlorothiazide (PRINZIDE,ZESTORETIC) 20-25 MG per tablet Take 1 tablet by mouth daily.    Historical Provider, MD  Multiple Vitamin (MULTIVITAMIN WITH MINERALS) TABS tablet Take 1 tablet by mouth daily.     Historical Provider, MD  Omega-3 Fatty Acids (OMEGA 3 PO) Take 1 capsule by mouth daily.    Historical Provider, MD  rosuvastatin (CRESTOR) 10 MG tablet Take 10 mg by mouth daily.    Historical Provider, MD  sulfamethoxazole-trimethoprim (BACTRIM DS) 800-160 MG per tablet Take 1 tablet by mouth 2 (two) times daily.    Historical Provider, MD   BP 108/54  Pulse 74  Temp(Src) 98.1 F (36.7 C) (Oral)  Resp 20  SpO2 100% Physical Exam  Physical Exam  Nursing note and vitals reviewed.  Constitutional: She appears well-developed and well-nourished. No distress.  HENT:  Head: Normocephalic and atraumatic.  Eyes: Conjunctivae are normal. Right eye exhibits no discharge. Left eye exhibits no discharge.  Neck: Neck supple.  Cardiovascular: Normal rate, regular rhythm and normal heart sounds. Exam reveals no gallop and no friction rub.  No murmur heard.  Pulmonary/Chest: Effort normal and breath sounds normal. No respiratory distress.  Abdominal: Soft. She exhibits no distension. There is no tenderness.  Musculoskeletal: She exhibits no edema and no tenderness.  Neurological: She is alert.  Skin: Skin is warm.    Well healed incisions from b/l knee replacements. Swelling of LLE from just below knee to the foot. Erythema and increased warmth from just above ankle to just below knee in depicted  area. This is circumferential. No area concerning for abscess. No obvious break in skin. Palpable DP pulse. Sensation intact to light touch. Can actively range ankle and knee with little/no apparent discomfort.  Psychiatric: She has a normal mood and affect. Her behavior is normal. Thought content normal.    ED Course  Procedures (including critical care time) Labs Review Labs Reviewed  CBC WITH DIFFERENTIAL - Abnormal; Notable for the following:    RBC 3.81 (*)    Hemoglobin 11.6 (*)    HCT 35.1 (*)    Lymphocytes Relative 10 (*)    Lymphs Abs 0.5 (*)    Eosinophils Relative 10 (*)    All  other components within normal limits  BASIC METABOLIC PANEL - Abnormal; Notable for the following:    BUN 47 (*)    Creatinine, Ser 2.33 (*)    GFR calc non Af Amer 19 (*)    GFR calc Af Amer 22 (*)    Anion gap 17 (*)    All other components within normal limits    Imaging Review No results found.   EKG Interpretation None      MDM   Final diagnoses:  Cellulitis of left lower extremity  Renal impairment    81yF with what is likely cellulitis of LLE. She is primarily complaining of itching though and doesn't seem particularly tender to touch. Consider alternative etiology, such as papular urticaria. Not really consistent with stasis dermatitis with acute onset, unilateral symptoms and lack of other typical skin findings. Possible lipodermatoclerosis, but I feel less likely.  She does complain of pain when bearing weight on LLE though which would give more support to cellulitis. Has been on bactrim/doxycycline for 4 days w/o significant improvement. Neither provide great strep coverage, but has PCN allergy. Could try changing to alternative with better strep coverage, but pt has been seen by PCP twice for this and in ED twice for this in past 4 days. I think at this point would be prudent to admit for IV abx and monitor response to tx. US done on last visit negative for DVT. Additionally, she has renal impairment of unclear chronicity. No old labs for comparison. On lasix PRN. ACEI/HCTZ. Naproxen. IVF for now. Further evaluation per admitting team.     Raeford Razor, MD 06/08/14 1705

## 2014-06-08 NOTE — ED Notes (Addendum)
Pt has cellulitis to left lower leg x 4 days. Denies fever. Pt alert and oriented and in NAD. Pt sent from Dr Juleen ChinaKohut office.

## 2014-06-08 NOTE — ED Notes (Signed)
hospitalist at bedside

## 2014-06-08 NOTE — Progress Notes (Signed)
UR completed 

## 2014-06-09 LAB — COMPREHENSIVE METABOLIC PANEL
ALT: 20 U/L (ref 0–35)
AST: 22 U/L (ref 0–37)
Albumin: 2.7 g/dL — ABNORMAL LOW (ref 3.5–5.2)
Alkaline Phosphatase: 99 U/L (ref 39–117)
Anion gap: 14 (ref 5–15)
BUN: 42 mg/dL — AB (ref 6–23)
CHLORIDE: 104 meq/L (ref 96–112)
CO2: 20 mEq/L (ref 19–32)
Calcium: 9.3 mg/dL (ref 8.4–10.5)
Creatinine, Ser: 1.95 mg/dL — ABNORMAL HIGH (ref 0.50–1.10)
GFR calc Af Amer: 27 mL/min — ABNORMAL LOW (ref >90)
GFR calc non Af Amer: 23 mL/min — ABNORMAL LOW (ref >90)
Glucose, Bld: 90 mg/dL (ref 70–99)
Potassium: 4.2 mEq/L (ref 3.7–5.3)
Sodium: 138 mEq/L (ref 137–147)
Total Protein: 6.4 g/dL (ref 6.0–8.3)

## 2014-06-09 LAB — CBC
HCT: 33.8 % — ABNORMAL LOW (ref 36.0–46.0)
HEMOGLOBIN: 11.1 g/dL — AB (ref 12.0–15.0)
MCH: 30.6 pg (ref 26.0–34.0)
MCHC: 32.8 g/dL (ref 30.0–36.0)
MCV: 93.1 fL (ref 78.0–100.0)
Platelets: 179 10*3/uL (ref 150–400)
RBC: 3.63 MIL/uL — ABNORMAL LOW (ref 3.87–5.11)
RDW: 13.8 % (ref 11.5–15.5)
WBC: 4.1 10*3/uL (ref 4.0–10.5)

## 2014-06-09 LAB — PROTIME-INR
INR: 1.09 (ref 0.00–1.49)
Prothrombin Time: 14.1 seconds (ref 11.6–15.2)

## 2014-06-09 NOTE — Progress Notes (Signed)
Note: This document was prepared with digital dictation and possible smart phrase technology. Any transcriptional errors that result from this process are unintentional.   Loretta OleaClara J Hartman ZOX:[id number] DOB: 12/29/1931 DOA: 06/08/2014 PCP: [name],[name], MD  Brief narrative: 80 y.o. female relatively healthy female known history hypertension, hyperlipidemia, obesity, status post bilateral TKR initially admit [place] [facility] c failed OP therapy for LE cellulitis   Past medical history-As per Problem list Chart reviewed as below- Reviewed   Consultants:   none  Procedures:  none  Antibiotics:  Vancomycin 8/10  Clindamycin 8/10   Subjective  Doing fair. Itchy LE's No CP sob no fever chills   Objective    Interim History:   Telemetry:    Objective: Filed Vitals:   06/08/14 1726 06/08/14 1831 06/08/14 2223 06/09/14 0515  BP: 129/58 120/61 118/50 104/67  Pulse: 65 66 64 70  Temp:  98.8 F (37.1 C) 99.6 F (37.6 C) 98.8 F (37.1 C)  TempSrc:  Oral Oral Oral  Resp: 16 17 18 16   Height:  5' (1.524 m)    Weight:  90.719 kg (200 lb)    SpO2: 93% 92% 100% 100%    Intake/Output Summary (Last 24 hours) at 06/09/14 1241 Last data filed at 06/09/14 1130  Gross per 24 hour  Intake      0 ml  Output   1000 ml  Net  -1000 ml    Exam:  General: eomi ncat Cardiovascular: s1 s2 no m/r/g Respiratory: clear no added Abdomen: soft nt nd Skin Le edema.  Swelling decreased.  Area marked out Neuro Intact  Data Reviewed: Basic Metabolic Panel:  Recent Labs Lab 06/08/14 1537 06/09/14 0413  NA 139 138  K 3.9 4.2  CL 100 104  CO2 22 20  GLUCOSE 92 90  BUN 47* 42*  CREATININE 2.33* 1.95*  CALCIUM 9.6 9.3   Liver Function Tests:  Recent Labs Lab 06/09/14 0413  AST 22  ALT 20  ALKPHOS 99  BILITOT <0.2*  PROT 6.4  ALBUMIN 2.7*   No results found for this basename: LIPASE, AMYLASE,  in the last 168 hours No results found for  this basename: AMMONIA,  in the last 168 hours CBC:  Recent Labs Lab 06/08/14 1537 06/09/14 0413  WBC 5.1 4.1  NEUTROABS 3.7  --   HGB 11.6* 11.1*  HCT 35.1* 33.8*  MCV 92.1 93.1  PLT 207 179   Cardiac Enzymes: No results found for this basename: CKTOTAL, CKMB, CKMBINDEX, TROPONINI,  in the last 168 hours BNP: No components found with this basename: POCBNP,  CBG: No results found for this basename: GLUCAP,  in the last 168 hours  No results found for this or any previous visit (from the past 240 hour(s)).   Studies:              All Imaging reviewed and is as per above notation   Scheduled Meds: . clindamycin (CLEOCIN) IV  600 mg Intravenous 3 times per day  . heparin  5,000 Units Subcutaneous 3 times per day  . vancomycin  750 mg Intravenous Q24H   Continuous Infusions: . sodium chloride    . sodium chloride 75 mL/hr at 06/08/14 1835     Assessment/Plan:  1. Cellulitis of left lower extremity-failed outpatient management-mild to moderate disease only given white count below 12 no fever no tachypnea no tachycardia agree with empiric coverage vancomycin. D/c Vanc IV today-clindamycin 600 3 times a  day. 2. Hypertension goal BP (blood pressure) < 130/80-she's on numerous antihypertensive medications Note that patient is on Naprosyn, HCTZ ACE inhibitor and Lasix, all of which will be discontinued.  She will need a different medication control her pressure, probably amlodipine 10 mg which I will start today. If this does not improve we can give her a beta blocker as well  3. Metabolic acidosis, increased anion gap (IAG)-unclear etiology-Resolved.  probably from Sub-acute renal failure-saline lock IVF 4. Chronic kidney disease (CKD), stage III (moderate)-se eabove 5. Hyperlipidemia  6. ? CHF-BNP indicative of Potential HF etiology-OP ECHO    Code Status: full Family Communication: none bedsdie Disposition Plan: inpatient pending resolution   Pleas Koch, MD  [facility] Pager [phone] 06/09/2014, 12:41 PM    LOS: 1 day

## 2014-06-09 NOTE — Progress Notes (Signed)
Nutrition Brief Note  Patient identified on the Malnutrition Screening Tool (MST) Report  Wt Readings from Last 15 Encounters:  06/08/14 200 lb (90.719 kg)  06/06/14 199 lb (90.266 kg)    Body mass index is 39.06 kg/(m^2). Patient meets criteria for Obesity II based on current BMI.   Current diet order is Heart healthy, patient is consuming approximately 50-75% of meals at this time. Labs and medications reviewed.   Pt reported loss of appetite/tastes for past week, which pt attributes to an antibiotics. Denied any weight loss. Appetite is gradually improving during admit, declined nutrition supplements. Reviewed nutrition therapy for taste/appetite changes and intermittent nausea. Verbalized understanding.  No nutrition interventions warranted at this time. If nutrition issues arise, please consult RD.   Lloyd HugerSarah F Nyashia Raney MS RD LDN Clinical Dietitian Pager:[phone]

## 2014-06-10 DIAGNOSIS — N179 Acute kidney failure, unspecified: Secondary | ICD-10-CM

## 2014-06-10 DIAGNOSIS — N189 Chronic kidney disease, unspecified: Secondary | ICD-10-CM

## 2014-06-10 DIAGNOSIS — R21 Rash and other nonspecific skin eruption: Secondary | ICD-10-CM

## 2014-06-10 LAB — CBC WITH DIFFERENTIAL/PLATELET
Basophils Absolute: 0 10*3/uL (ref 0.0–0.1)
Basophils Relative: 1 % (ref 0–1)
EOS PCT: 17 % — AB (ref 0–5)
Eosinophils Absolute: 0.7 10*3/uL (ref 0.0–0.7)
HCT: 31.9 % — ABNORMAL LOW (ref 36.0–46.0)
HEMOGLOBIN: 10.7 g/dL — AB (ref 12.0–15.0)
Lymphocytes Relative: 28 % (ref 12–46)
Lymphs Abs: 1.1 10*3/uL (ref 0.7–4.0)
MCH: 30.8 pg (ref 26.0–34.0)
MCHC: 33.5 g/dL (ref 30.0–36.0)
MCV: 91.9 fL (ref 78.0–100.0)
MONO ABS: 0.3 10*3/uL (ref 0.1–1.0)
MONOS PCT: 7 % (ref 3–12)
Neutro Abs: 1.9 10*3/uL (ref 1.7–7.7)
Neutrophils Relative %: 47 % (ref 43–77)
Platelets: 193 10*3/uL (ref 150–400)
RBC: 3.47 MIL/uL — ABNORMAL LOW (ref 3.87–5.11)
RDW: 13.9 % (ref 11.5–15.5)
WBC: 4 10*3/uL (ref 4.0–10.5)

## 2014-06-10 LAB — BASIC METABOLIC PANEL
Anion gap: 12 (ref 5–15)
BUN: 36 mg/dL — AB (ref 6–23)
CHLORIDE: 106 meq/L (ref 96–112)
CO2: 20 mEq/L (ref 19–32)
CREATININE: 1.7 mg/dL — AB (ref 0.50–1.10)
Calcium: 9.3 mg/dL (ref 8.4–10.5)
GFR calc Af Amer: 32 mL/min — ABNORMAL LOW (ref >90)
GFR, EST NON AFRICAN AMERICAN: 27 mL/min — AB (ref >90)
Glucose, Bld: 94 mg/dL (ref 70–99)
Potassium: 4.4 mEq/L (ref 3.7–5.3)
Sodium: 138 mEq/L (ref 137–147)

## 2014-06-10 MED ORDER — VANCOMYCIN HCL 10 G IV SOLR
1500.0000 mg | Freq: Once | INTRAVENOUS | Status: AC
  Administered 2014-06-10: 1500 mg via INTRAVENOUS
  Filled 2014-06-10: qty 1500

## 2014-06-10 MED ORDER — SULFAMETHOXAZOLE-TMP DS 800-160 MG PO TABS
1.0000 | ORAL_TABLET | Freq: Two times a day (BID) | ORAL | Status: DC
  Filled 2014-06-10 (×2): qty 1

## 2014-06-10 MED ORDER — SODIUM CHLORIDE 0.9 % IV SOLN: INTRAVENOUS | Status: DC | PRN

## 2014-06-10 MED ORDER — DIPHENHYDRAMINE HCL 25 MG PO CAPS: 25.0000 mg | ORAL_CAPSULE | Freq: Three times a day (TID) | ORAL | Status: DC | PRN

## 2014-06-10 MED ORDER — VANCOMYCIN HCL IN DEXTROSE 1-5 GM/200ML-% IV SOLN
1000.0000 mg | INTRAVENOUS | Status: DC
  Administered 2014-06-11: 1000 mg via INTRAVENOUS
  Filled 2014-06-10: qty 200

## 2014-06-10 MED ORDER — SODIUM CHLORIDE 0.9 % IJ SOLN
3.0000 mL | Freq: Two times a day (BID) | INTRAMUSCULAR | Status: DC
  Administered 2014-06-10: 23:00:00 via INTRAVENOUS
  Administered 2014-06-10 – 2014-06-11 (×2): 3 mL via INTRAVENOUS

## 2014-06-10 NOTE — Progress Notes (Signed)
[facility] PROGRESS NOTE  Loretta OleaClara J Hartman [id number] DOB: 12/04/1931 DOA: 06/08/2014 PCP: [name],[name], MD  Assessment/Plan: Cellulitis of LLE  failed outpt oral abx ( bactrim and doxy), started on IV clindamycin ( after receiving 1 dose of vanco in ED). Now develops itchy macular rash. clindamycin stopped and abx will be switched to IV vanco ( clinically does not appear to have improved) -doppler LE negative for DVT on admission.  Allergic rash  possibly to clindamycin. Now dced. ( added clinda as allergy). Prn benadryl for itching.  HTN  pt on multiple antihypertensive as outpt. Including ACEi, lasix, HCTZ  Which will all be ced. She has underling CKD ( no baseline known). No hx of CHF. avoiding all home BP meds and BP has been stable. Will start on low dose  amlodipine if needed  Hyperlipidemia  continue crestor  AKI on CKD Her renal fn on aug 10th at PCP office  was BUN OF  30 and cr of 1.55 ( 1.46 in 3/15 and 1.38 in 12/14) , hb 11.6. avoiding ACEi, lasix and HCTZ give acute on CKD , also on naprosyn which will be dced Renal fn slowly improving  DVT prophylaxis Sq heparin  Diet: low sodium   Code Status: full code Family Communication: sone at bedside Disposition Plan: home once improved   Consultants:  NONE  Procedures:  NONE  Antibiotics:  Iv vanco ( 8/10--  IV clinda ( 8/11-12)  Iv VANCO (8/12--)  HPI/Subjective: Pt seen and examined . Developed itchy macular rash over the legs , ars and back overnight  Objective: Filed Vitals:   06/10/14 0527  BP: 107/52  Pulse: 65  Temp: 98.2 F (36.8 C)  Resp: 18    Intake/Output Summary (Last 24 hours) at 06/10/14 1016 Last data filed at 06/10/14 [id number]  Gross per 24 hour  Intake 2531.25 ml  Output   1400 ml  Net 1131.25 ml   Filed Weights   06/08/14 1538 06/08/14 1831  Weight: 90.719 kg (200 lb) 90.719 kg (200 lb)    Exam:   General:  Elderly female in NAD  HEENT: no pallor,  moist mucosa  Chest: clear b/l  Abd: soft, NT, ND BS+  Ext: swelling with erythema over left leg ( upper tibia) extending to the ankle. Non tender, macular rash over bilateral legs , forearms and areas on the back  CNS: alert and oriented.   Data Reviewed: Basic Metabolic Panel:  Recent Labs Lab 06/08/14 1537 06/09/14 0413 06/10/14 0403  NA 139 138 138  K 3.9 4.2 4.4  CL 100 104 106  CO2 22 20 20   GLUCOSE 92 90 94  BUN 47* 42* 36*  CREATININE 2.33* 1.95* 1.70*  CALCIUM 9.6 9.3 9.3   Liver Function Tests:  Recent Labs Lab 06/09/14 0413  AST 22  ALT 20  ALKPHOS 99  BILITOT <0.2*  PROT 6.4  ALBUMIN 2.7*   No results found for this basename: LIPASE, AMYLASE,  in the last 168 hours No results found for this basename: AMMONIA,  in the last 168 hours CBC:  Recent Labs Lab 06/08/14 1537 06/09/14 0413 06/10/14 0403  WBC 5.1 4.1 4.0  NEUTROABS 3.7  --  1.9  HGB 11.6* 11.1* 10.7*  HCT 35.1* 33.8* 31.9*  MCV 92.1 93.1 91.9  PLT 207 179 193   Cardiac Enzymes: No results found for this basename: CKTOTAL, CKMB, CKMBINDEX, TROPONINI,  in the last 168 hours BNP (last 3 results)  Recent Labs  06/08/14  1537  PROBNP 453.9*   CBG: No results found for this basename: GLUCAP,  in the last 168 hours  No results found for this or any previous visit (from the past 240 hour(s)).   Studies: No results found.  Scheduled Meds: . heparin  5,000 Units Subcutaneous 3 times per day  . sodium chloride  3 mL Intravenous Q12H  . vancomycin  1,500 mg Intravenous Once  . [START ON 06/11/2014] vancomycin  1,000 mg Intravenous Q24H   Continuous Infusions: . sodium chloride        Time spent: 25 minutes    Cyerra Yim  [facility] Pager [phone] If 7PM-7AM, please contact night-coverage at [email or web address], password [email or web address] 06/10/2014, 10:16 AM  LOS: 2 days

## 2014-06-10 NOTE — Progress Notes (Signed)
Patient with progression of rash on back/r leg/abdomen/arms. Patient still denies itching. Spoke to Graybar ElectricBeth in pharmacy. Will page MD after 0700 to let him know of rash. Hold cleocin til MD sees.

## 2014-06-10 NOTE — Progress Notes (Addendum)
ANTIBIOTIC CONSULT NOTE - INITIAL  Pharmacy Consult for Vancomycin Indication: cellulitis  Allergies  Allergen Reactions  . Penicillins Anaphylaxis  . Clindamycin/Lincomycin Rash    Patient Measurements: Height: 5' (152.4 cm) Weight: 200 lb (90.719 kg) IBW/kg (Calculated) : 45.5  Vital Signs: Temp: 98.2 F (36.8 C) (08/12 0527) Temp src: Oral (08/12 0527) BP: 107/52 mmHg (08/12 0527) Pulse Rate: 65 (08/12 0527) Intake/Output from previous day: 08/11 0701 - 08/12 0700 In: 2771.3 [P.O.:840; I.V.:1831.3; IV Piggyback:100] Out: 1700 [Urine:1700] Intake/Output from this shift:    Labs:  Recent Labs  06/08/14 1537 06/09/14 0413 06/10/14 0403  WBC 5.1 4.1 4.0  HGB 11.6* 11.1* 10.7*  PLT 207 179 193  CREATININE 2.33* 1.95* 1.70*   Estimated Creatinine Clearance: 26.9 ml/min (by C-G formula based on Cr of 1.7). No results found for this basename: VANCOTROUGH, VANCOPEAK, VANCORANDOM, GENTTROUGH, GENTPEAK, GENTRANDOM, TOBRATROUGH, TOBRAPEAK, TOBRARND, AMIKACINPEAK, AMIKACINTROU, AMIKACIN,  in the last 72 hours   Microbiology: No results found for this or any previous visit (from the past 720 hour(s)).  Medical History: Past Medical History  Diagnosis Date  . Bronchitis   . Hypertension   . Hyperlipidemia    Assessment: 81 YOF admitted 8/10 with L LE leg swelling and cellulitis. LE dopplers negative for DVT. Started on doxycycline and TMP/SMZ by PCP on 8/6. (hole in coverage = Grp A strep).  8/10 >> Clinda (MD) >> 8/12 8/10 >> Vanc x 1 dose 8/12 >> Vanc   Tmax: afeb WBCs: WNL Renal: Stage 3 CKD. SCr elevated but improving, 27CG, , 30N  No cultures  8/12: Night RN noted rash on hands, arms, back, and BOTH legs. Worse this AM. Only itchy in legs. Only got 1 dose of Vanc on 8/10. Likely allergy to Clinda. Switching to Vancomycin.  Goal of Therapy:  Vancomycin trough: 10-15 mg/l Appropriate antibiotic dosing for renal function; eradication of infection  Plan:    Start Vanc 1.5g x1 then 1g IV q24h.  Clindamycin allergy added to record at MD's request. Measure Vanc trough at steady state. Follow up renal fxn and culture results.  Charolotte Ekeom Can Lucci, PharmD, pager [phone]. 06/10/2014,9:57 AM.

## 2014-06-10 NOTE — Plan of Care (Signed)
Problem: Phase I Progression Outcomes Goal: Pain controlled with appropriate interventions Outcome: Not Applicable Date Met:  97/53/00 Patient c/o burning and itching of legs. Denies "pain".

## 2014-06-10 NOTE — Progress Notes (Signed)
Patient with rash to legs right > left, back, arms--red flat areas. Patient denies itching. Patient been on several antibiotics in last few days. Had not noticed rash per patient.

## 2014-06-11 LAB — BASIC METABOLIC PANEL
Anion gap: 10 (ref 5–15)
BUN: 31 mg/dL — AB (ref 6–23)
CHLORIDE: 106 meq/L (ref 96–112)
CO2: 21 mEq/L (ref 19–32)
CREATININE: 1.38 mg/dL — AB (ref 0.50–1.10)
Calcium: 9.1 mg/dL (ref 8.4–10.5)
GFR calc Af Amer: 41 mL/min — ABNORMAL LOW (ref >90)
GFR calc non Af Amer: 35 mL/min — ABNORMAL LOW (ref >90)
Glucose, Bld: 95 mg/dL (ref 70–99)
Potassium: 4.3 mEq/L (ref 3.7–5.3)
Sodium: 137 mEq/L (ref 137–147)

## 2014-06-11 MED ORDER — DOXYCYCLINE HYCLATE 100 MG PO TABS: 100.0000 mg | ORAL_TABLET | Freq: Two times a day (BID) | ORAL | Status: DC

## 2014-06-11 MED ORDER — DOXYCYCLINE HYCLATE 100 MG PO TABS: 100.0000 mg | ORAL_TABLET | Freq: Two times a day (BID) | ORAL | Status: AC

## 2014-06-11 MED ORDER — LISINOPRIL 10 MG PO TABS: 10.0000 mg | ORAL_TABLET | Freq: Every day | ORAL | Status: DC

## 2014-06-11 MED ORDER — DIPHENHYDRAMINE HCL 25 MG PO CAPS: 25.0000 mg | ORAL_CAPSULE | Freq: Three times a day (TID) | ORAL | Status: DC | PRN

## 2014-06-11 NOTE — Progress Notes (Signed)
Nursing Discharge Summary  Patient ID: Loretta OleaClara J Hartman MRN: [id number] DOB/AGE: 78/11/1931 78 y.o.  Admit date: 06/08/2014 Discharge date: 06/11/2014  Discharged Condition: good  Disposition: 01-Home or Self Care  Follow-up Information   Follow up with [name],[name], MD. Schedule an appointment as soon as possible for a visit in 1 week.   Specialty:  Endocrinology   Contact information:   [address] [place] [address] [phone]       Prescriptions Given: Prescriptions for antibiotics and lisinopril given. Patient follow up appointments discussed. Patient and son verbalized understanding without further questions.   Means of Discharge: Patient to be taken downstairs via wheelchair to be transported via private vehicle.   Signed: Gloriajean DellBaldwin, Kinsley Holderman Danielle 06/11/2014, 12:47 PM

## 2014-06-11 NOTE — Discharge Summary (Signed)
Physician Discharge Summary  Loretta Hartman ZOX:[id number] DOB: Jul 26, 1932 DOA: 06/08/2014  PCP: Michiel Sites, MD  Admit date: 06/08/2014 Discharge date: 06/11/2014  Time spent: 35 minutes  Recommendations for Outpatient Follow-up:  1. D/c home with outpt PCP follow up in `1 week. Pt will be discharged on 7 days of oral doxycycline to complete a 14 day course of abx  Discharge Diagnoses:     Cellulitis of left lower extremity-failed outpatient management  Active Problems:   Hypertension    Metabolic acidosis, increased anion gap (IAG)   Chronic kidney disease (CKD), stage III (moderate)   Hyperlipidemia   Acute on chronic renal failure   Rash and nonspecific skin eruption   Discharge Condition: fair  Diet recommendation: low sodium  Filed Weights   06/08/14 1538 06/08/14 1831  Weight: 90.719 kg (200 lb) 90.719 kg (200 lb)    History of present illness:  Please refer to admission H&P for details, but in brief, 80 y.o. Female with known history of hypertension, hyperlipidemia, obesity, status post bilateral TKR  presented to [facility] long ED on  06/06/14 with left lower extremity swelling tenderness pain. She was placed on Bactrim and doxycycline on 06/02/14 by PCP. She returned home and she did not want to stay in the hospital. She had a Doppler ultrasound of the lower extremities well which proved negative for DVT . She Subsequently came back 06/08/2014 with worsening of the swelling, inability to bear weight on the left leg.  She states she's been taking antibiotics as directed . She has no constitutional fever, chills nausea vomiting or systemic indices of infection. Patient admitted to hospital for failed outpt management for cellulitis. She received a dose of  IV vancomycin in the ED.  Hospital Course:  Cellulitis of LLE  failed outpt oral abx ( bactrim and doxy), started on IV clindamycin ( after receiving 1 dose of vanco in ED). Son day 2 , she developed itchy macular  rash over bilateral legs, forearms and back. Clindamycin was discontinued and pt placed back on IV vancomycin..  -doppler LE negative for DVT on admission.  Her cellulitis and skin rash have much improved today. Pt encouraged to keep leg elevated.  i will discharge her on oral doxycycline for a 10 day course to complete a total 14 day course of antibiotics. She should follow up with her PCP in 1 week.   Allergic rash  possibly to clindamycin. Now dced. ( added clinda as allergy). Now improved. Prn benadryl for itching.   HTN  pt on multiple antihypertensive as outpt. Including ACEi, lasix, HCTZ which have all been dced and will discharge on low dose lisinopril ced. She has underling CKD ( no baseline known). No hx of CHF. BP has been stable without any medications in the hospital.   Hyperlipidemia  continue crestor   AKI on CKD  Her renal fn on aug 10th at PCP office was BUN OF 30 and cr of 1.55 ( 1.46 in 3/15 and 1.38 in 12/14) , hb 11.6.  Held ACei lasix and HCTZ give acute on CKD , also on naprosyn which will be dced  Renal fn improved this am with cr of 1.38. Will resume low dose lisinopril upon discharge. D/c lasix and HCTZ.   Diet: low sodium    Code Status: full code    Family Communication: Spoke with son on 8/12  Disposition Plan: home    Consultants:  NONE   Procedures:  NONE   Antibiotics:  Iv  vanco ( 8/10--  IV clinda ( 8/11-12)  IV vanco (8/12--8/13)     Discharge Exam: Filed Vitals:   06/11/14 0505  BP: 99/64  Pulse: 62  Temp: 98.2 F (36.8 C)  Resp: 16    General: Elderly female in NAD  HEENT: no pallor, moist mucosa  Chest: clear b/l  Abd: soft, NT, ND BS+  Ext: improved swelling with erythema over left leg ( upper tibia) extending to the ankle.  Non tender, few macular rash over bilateral legs( improved from yesterday)  , minimal rash over forearms and areas on the back ( again much improved from yesterday) CNS: alert and  oriented.   Discharge Instructions You were cared for by a hospitalist during your hospital stay. If you have any questions about your discharge medications or the care you received while you were in the hospital after you are discharged, you can call the unit and asked to speak with the hospitalist on call if the hospitalist that took care of you is not available. Once you are discharged, your primary care physician will handle any further medical issues. Please note that NO REFILLS for any discharge medications will be authorized once you are discharged, as it is imperative that you return to your primary care physician (or establish a relationship with a primary care physician if you do not have one) for your aftercare needs so that they can reassess your need for medications and monitor your lab values.     Medication List    STOP taking these medications       furosemide 40 MG tablet  Commonly known as:  LASIX     lisinopril-hydrochlorothiazide 20-25 MG per tablet  Commonly known as:  PRINZIDE,ZESTORETIC     naproxen sodium 220 MG tablet  Commonly known as:  ANAPROX     sulfamethoxazole-trimethoprim 800-160 MG per tablet  Commonly known as:  BACTRIM DS      TAKE these medications       aspirin EC 81 MG tablet  Take 81 mg by mouth daily.     diphenhydrAMINE 25 mg capsule  Commonly known as:  BENADRYL  Take 1 capsule (25 mg total) by mouth every 8 (eight) hours as needed for itching or allergies.     doxycycline 100 MG tablet  Commonly known as:  VIBRA-TABS  Take 1 tablet (100 mg total) by mouth 2 (two) times daily. Until 06/20/2014     lisinopril 10 MG tablet  Commonly known as:  PRINIVIL,ZESTRIL  Take 1 tablet (10 mg total) by mouth daily.     multivitamin with minerals Tabs tablet  Take 1 tablet by mouth daily.     OMEGA 3 PO  Take 1 capsule by mouth daily.     rosuvastatin 10 MG tablet  Commonly known as:  CRESTOR  Take 10 mg by mouth at bedtime.        Allergies  Allergen Reactions  . Penicillins Anaphylaxis  . Clindamycin/Lincomycin Rash       Follow-up Information   Follow up with [name],[name], MD. Schedule an appointment as soon as possible for a visit in 1 week.   Specialty:  Endocrinology   Contact information:   [address] [place] [address] [phone]        The results of significant diagnostics from this hospitalization (including imaging, microbiology, ancillary and laboratory) are listed below for reference.    Significant Diagnostic Studies: No results found.  Microbiology: No results found for  this or any previous visit (from the past 240 hour(s)).   Labs: Basic Metabolic Panel:  Recent Labs Lab 06/08/14 1537 06/09/14 0413 06/10/14 0403 06/11/14 0413  NA 139 138 138 137  K 3.9 4.2 4.4 4.3  CL 100 104 106 106  CO2 22 20 20 21   GLUCOSE 92 90 94 95  BUN 47* 42* 36* 31*  CREATININE 2.33* 1.95* 1.70* 1.38*  CALCIUM 9.6 9.3 9.3 9.1   Liver Function Tests:  Recent Labs Lab 06/09/14 0413  AST 22  ALT 20  ALKPHOS 99  BILITOT <0.2*  PROT 6.4  ALBUMIN 2.7*   No results found for this basename: LIPASE, AMYLASE,  in the last 168 hours No results found for this basename: AMMONIA,  in the last 168 hours CBC:  Recent Labs Lab 06/08/14 1537 06/09/14 0413 06/10/14 0403  WBC 5.1 4.1 4.0  NEUTROABS 3.7  --  1.9  HGB 11.6* 11.1* 10.7*  HCT 35.1* 33.8* 31.9*  MCV 92.1 93.1 91.9  PLT 207 179 193   Cardiac Enzymes: No results found for this basename: CKTOTAL, CKMB, CKMBINDEX, TROPONINI,  in the last 168 hours BNP: BNP (last 3 results)  Recent Labs  06/08/14 1537  PROBNP 453.9*   CBG: No results found for this basename: GLUCAP,  in the last 168 hours     Signed:  Shirel Mallis  [facility] 06/11/2014, 11:52 AM

## 2014-06-11 NOTE — Discharge Instructions (Signed)

## 2014-09-22 ENCOUNTER — Observation Stay (HOSPITAL_COMMUNITY)
Admission: EM | Admit: 2014-09-22 | Discharge: 2014-09-24 | Disposition: A | Discharge status: Home / Self Care | Payer: PRIVATE HEALTH INSURANCE | Attending: Internal Medicine | Admitting: Internal Medicine

## 2014-09-22 ENCOUNTER — Encounter (HOSPITAL_COMMUNITY): Payer: Self-pay | Admitting: *Deleted

## 2014-09-22 DIAGNOSIS — D62 Acute posthemorrhagic anemia: Secondary | ICD-10-CM | POA: Diagnosis present

## 2014-09-22 DIAGNOSIS — I129 Hypertensive chronic kidney disease with stage 1 through stage 4 chronic kidney disease, or unspecified chronic kidney disease: Secondary | ICD-10-CM | POA: Diagnosis not present

## 2014-09-22 DIAGNOSIS — N189 Chronic kidney disease, unspecified: Secondary | ICD-10-CM | POA: Diagnosis not present

## 2014-09-22 DIAGNOSIS — K921 Melena: Secondary | ICD-10-CM | POA: Diagnosis not present

## 2014-09-22 DIAGNOSIS — E785 Hyperlipidemia, unspecified: Secondary | ICD-10-CM | POA: Insufficient documentation

## 2014-09-22 DIAGNOSIS — Z7982 Long term (current) use of aspirin: Secondary | ICD-10-CM | POA: Insufficient documentation

## 2014-09-22 DIAGNOSIS — Z800 Family history of malignant neoplasm of digestive organs: Secondary | ICD-10-CM | POA: Diagnosis not present

## 2014-09-22 DIAGNOSIS — Z87891 Personal history of nicotine dependence: Secondary | ICD-10-CM | POA: Insufficient documentation

## 2014-09-22 DIAGNOSIS — K922 Gastrointestinal hemorrhage, unspecified: Secondary | ICD-10-CM | POA: Diagnosis present

## 2014-09-22 DIAGNOSIS — Z79899 Other long term (current) drug therapy: Secondary | ICD-10-CM | POA: Insufficient documentation

## 2014-09-22 DIAGNOSIS — K5791 Diverticulosis of intestine, part unspecified, without perforation or abscess with bleeding: Secondary | ICD-10-CM | POA: Diagnosis not present

## 2014-09-22 DIAGNOSIS — Z880 Allergy status to penicillin: Secondary | ICD-10-CM | POA: Diagnosis not present

## 2014-09-22 DIAGNOSIS — N183 Chronic kidney disease, stage 3 unspecified: Secondary | ICD-10-CM | POA: Diagnosis present

## 2014-09-22 DIAGNOSIS — I10 Essential (primary) hypertension: Secondary | ICD-10-CM | POA: Diagnosis present

## 2014-09-22 LAB — CBC WITH DIFFERENTIAL/PLATELET
BASOS ABS: 0 10*3/uL (ref 0.0–0.1)
BASOS PCT: 0 % (ref 0–1)
BASOS PCT: 0 % (ref 0–1)
BASOS PCT: 0 % (ref 0–1)
Basophils Absolute: 0 10*3/uL (ref 0.0–0.1)
Basophils Absolute: 0 10*3/uL (ref 0.0–0.1)
EOS ABS: 0.1 10*3/uL (ref 0.0–0.7)
Eosinophils Absolute: 0 10*3/uL (ref 0.0–0.7)
Eosinophils Absolute: 0.2 10*3/uL (ref 0.0–0.7)
Eosinophils Relative: 0 % (ref 0–5)
Eosinophils Relative: 1 % (ref 0–5)
Eosinophils Relative: 3 % (ref 0–5)
HCT: 33.4 % — ABNORMAL LOW (ref 36.0–46.0)
HEMATOCRIT: 33.8 % — AB (ref 36.0–46.0)
HEMATOCRIT: 35.1 % — AB (ref 36.0–46.0)
HEMOGLOBIN: 10.7 g/dL — AB (ref 12.0–15.0)
Hemoglobin: 11 g/dL — ABNORMAL LOW (ref 12.0–15.0)
Hemoglobin: 11 g/dL — ABNORMAL LOW (ref 12.0–15.0)
Lymphocytes Relative: 24 % (ref 12–46)
Lymphocytes Relative: 35 % (ref 12–46)
Lymphocytes Relative: 44 % (ref 12–46)
Lymphs Abs: 1.3 10*3/uL (ref 0.7–4.0)
Lymphs Abs: 1.7 10*3/uL (ref 0.7–4.0)
Lymphs Abs: 2.3 10*3/uL (ref 0.7–4.0)
MCH: 30.9 pg (ref 26.0–34.0)
MCH: 30.8 pg (ref 26.0–34.0)
MCH: 30.4 pg (ref 26.0–34.0)
MCHC: 32.5 g/dL (ref 30.0–36.0)
MCHC: 32 g/dL (ref 30.0–36.0)
MCHC: 31.3 g/dL (ref 30.0–36.0)
MCV: 94.9 fL (ref 78.0–100.0)
MCV: 96.3 fL (ref 78.0–100.0)
MCV: 97 fL (ref 78.0–100.0)
MONO ABS: 0.3 10*3/uL (ref 0.1–1.0)
MONO ABS: 0.4 10*3/uL (ref 0.1–1.0)
MONOS PCT: 7 % (ref 3–12)
Monocytes Absolute: 0.3 10*3/uL (ref 0.1–1.0)
Monocytes Relative: 6 % (ref 3–12)
Monocytes Relative: 7 % (ref 3–12)
NEUTROS ABS: 3.8 10*3/uL (ref 1.7–7.7)
NEUTROS ABS: 2.7 10*3/uL (ref 1.7–7.7)
NEUTROS ABS: 2.4 10*3/uL (ref 1.7–7.7)
NEUTROS PCT: 57 % (ref 43–77)
Neutrophils Relative %: 70 % (ref 43–77)
Neutrophils Relative %: 46 % (ref 43–77)
PLATELETS: 239 10*3/uL (ref 150–400)
Platelets: 243 10*3/uL (ref 150–400)
Platelets: 232 10*3/uL (ref 150–400)
RBC: 3.56 MIL/uL — ABNORMAL LOW (ref 3.87–5.11)
RBC: 3.47 MIL/uL — ABNORMAL LOW (ref 3.87–5.11)
RBC: 3.62 MIL/uL — ABNORMAL LOW (ref 3.87–5.11)
RDW: 13.9 % (ref 11.5–15.5)
RDW: 14 % (ref 11.5–15.5)
RDW: 14 % (ref 11.5–15.5)
WBC: 5.5 10*3/uL (ref 4.0–10.5)
WBC: 4.8 10*3/uL (ref 4.0–10.5)
WBC: 5.2 10*3/uL (ref 4.0–10.5)

## 2014-09-22 LAB — CBC
HCT: 35.2 % — ABNORMAL LOW (ref 36.0–46.0)
HEMOGLOBIN: 11.3 g/dL — AB (ref 12.0–15.0)
MCH: 30.8 pg (ref 26.0–34.0)
MCHC: 32.1 g/dL (ref 30.0–36.0)
MCV: 95.9 fL (ref 78.0–100.0)
Platelets: 259 10*3/uL (ref 150–400)
RBC: 3.67 MIL/uL — ABNORMAL LOW (ref 3.87–5.11)
RDW: 13.8 % (ref 11.5–15.5)
WBC: 4.3 10*3/uL (ref 4.0–10.5)

## 2014-09-22 LAB — COMPREHENSIVE METABOLIC PANEL
ALT: 14 U/L (ref 0–35)
ANION GAP: 11 (ref 5–15)
AST: 24 U/L (ref 0–37)
Albumin: 3.1 g/dL — ABNORMAL LOW (ref 3.5–5.2)
Alkaline Phosphatase: 97 U/L (ref 39–117)
BUN: 39 mg/dL — AB (ref 6–23)
CALCIUM: 9.9 mg/dL (ref 8.4–10.5)
CO2: 27 mEq/L (ref 19–32)
Chloride: 104 mEq/L (ref 96–112)
Creatinine, Ser: 1.48 mg/dL — ABNORMAL HIGH (ref 0.50–1.10)
GFR calc non Af Amer: 32 mL/min — ABNORMAL LOW (ref >90)
GFR, EST AFRICAN AMERICAN: 37 mL/min — AB (ref >90)
GLUCOSE: 121 mg/dL — AB (ref 70–99)
Potassium: 4.9 mEq/L (ref 3.7–5.3)
SODIUM: 142 meq/L (ref 137–147)
Total Bilirubin: <0.2 mg/dL — ABNORMAL LOW (ref 0.3–1.2)
Total Protein: 7.1 g/dL (ref 6.0–8.3)

## 2014-09-22 LAB — TYPE AND SCREEN
ABO/RH(D): AB POS
ANTIBODY SCREEN: NEGATIVE

## 2014-09-22 LAB — ABO/RH: ABO/RH(D): AB POS

## 2014-09-22 LAB — POC OCCULT BLOOD, ED: Fecal Occult Bld: POSITIVE — AB

## 2014-09-22 MED ORDER — ROSUVASTATIN CALCIUM 10 MG PO TABS
10.0000 mg | ORAL_TABLET | Freq: Every day | ORAL | Status: DC
  Administered 2014-09-22 – 2014-09-23 (×2): 10 mg via ORAL
  Filled 2014-09-22 (×3): qty 1

## 2014-09-22 MED ORDER — OMEGA-3-ACID ETHYL ESTERS 1 G PO CAPS
1.0000 | ORAL_CAPSULE | Freq: Every day | ORAL | Status: DC
Start: 2014-09-22 — End: 2014-09-24
  Administered 2014-09-22 – 2014-09-24 (×2): 1 g via ORAL
  Filled 2014-09-22 (×3): qty 1

## 2014-09-22 MED ORDER — SODIUM CHLORIDE 0.9 % IV SOLN
INTRAVENOUS | Status: DC
  Administered 2014-09-22: 23:00:00 via INTRAVENOUS
  Administered 2014-09-22: 75 mL/h via INTRAVENOUS
  Administered 2014-09-23: 13:00:00 via INTRAVENOUS

## 2014-09-22 MED ORDER — PANTOPRAZOLE SODIUM 40 MG IV SOLR
40.0000 mg | INTRAVENOUS | Status: DC
  Administered 2014-09-22 – 2014-09-23 (×2): 40 mg via INTRAVENOUS
  Filled 2014-09-22 (×3): qty 40

## 2014-09-22 MED ORDER — PEG 3350-KCL-NA BICARB-NACL 420 G PO SOLR
4000.0000 mL | Freq: Once | ORAL | Status: AC
  Administered 2014-09-22: 4000 mL via ORAL

## 2014-09-22 MED ORDER — SODIUM CHLORIDE 0.9 % IV SOLN: INTRAVENOUS | Status: DC

## 2014-09-22 NOTE — ED Provider Notes (Signed)
CSN: [id number]     Arrival date & time 09/22/14  0150 History   First MD Initiated Contact with Patient 09/22/14 [phone]     Chief Complaint  Patient presents with  . Rectal Bleeding     (Consider location/radiation/quality/duration/timing/severity/associated sxs/prior Treatment) Patient is a 80 y.o. female presenting with hematochezia. The history is provided by the patient. No language interpreter was used.  Rectal Bleeding Quality:  Bright red Amount:  Moderate Duration:  1 day Associated symptoms: no abdominal pain, no fever, no light-headedness and no vomiting   Associated symptoms comment:  She reports needing to have a bowel movement last evening but passing only blood when she went to the bathroom. She denies abdominal pain, N, V. She had a second bloody BM subsequently. She denies history of GI bleeding. Last colonoscopy was 8 years ago and there was no abnormal finding, per patient.    Past Medical History  Diagnosis Date  . Bronchitis   . Hypertension   . Hyperlipidemia    Past Surgical History  Procedure Laterality Date  . Total knee arthroplasty      bilateral  . Cholecystectomy     Family History  Problem Relation Age of Onset  . Cancer Father   . Cancer Brother   . Diabetes Brother    History  Substance Use Topics  . Smoking status: Former Smoker -- 25 years    Quit date: 06/08/1990  . Smokeless tobacco: Current User    Types: Snuff  . Alcohol Use: No   OB History    No data available     Review of Systems  Constitutional: Negative for fever and chills.  HENT: Negative.   Respiratory: Negative.   Cardiovascular: Negative.   Gastrointestinal: Positive for blood in stool and hematochezia. Negative for nausea, vomiting and abdominal pain.  Genitourinary: Negative.   Musculoskeletal: Negative.   Skin: Negative.   Neurological: Negative.  Negative for syncope, weakness and light-headedness.      Allergies  Penicillins and  Clindamycin/lincomycin  Home Medications   Prior to Admission medications   Medication Sig Start Date End Date Taking? Authorizing Provider  aspirin EC 81 MG tablet Take 81 mg by mouth daily.    Historical Provider, MD  diphenhydrAMINE (BENADRYL) 25 mg capsule Take 1 capsule (25 mg total) by mouth every 8 (eight) hours as needed for itching or allergies. 06/11/14   Nishant Dhungel, MD  lisinopril (PRINIVIL,ZESTRIL) 10 MG tablet Take 1 tablet (10 mg total) by mouth daily. 06/11/14   Nishant Dhungel, MD  Multiple Vitamin (MULTIVITAMIN WITH MINERALS) TABS tablet Take 1 tablet by mouth daily.    Historical Provider, MD  Omega-3 Fatty Acids (OMEGA 3 PO) Take 1 capsule by mouth daily.    Historical Provider, MD  rosuvastatin (CRESTOR) 10 MG tablet Take 10 mg by mouth at bedtime.     Historical Provider, MD   BP 122/66 mmHg  Pulse 77  Temp(Src) 98 F (36.7 C) (Oral)  Resp 16  SpO2 95% Physical Exam  Constitutional: She is oriented to person, place, and time. She appears well-developed and well-nourished.  HENT:  Head: Normocephalic.  Neck: Normal range of motion. Neck supple.  Cardiovascular: Normal rate and regular rhythm.   Pulmonary/Chest: Effort normal and breath sounds normal.  Abdominal: Soft. Bowel sounds are normal. There is no tenderness. There is no rebound and no guarding.  Genitourinary: Guaiac positive stool.  Blood present at rectum.   Musculoskeletal: Normal range of motion.  Neurological: She is  alert and oriented to person, place, and time.  Skin: Skin is warm and dry. No rash noted.  Psychiatric: She has a normal mood and affect.    ED Course  Procedures (including critical care time) Labs Review Labs Reviewed  POC OCCULT BLOOD, ED - Abnormal; Notable for the following:    Fecal Occult Bld POSITIVE (*)    All other components within normal limits  CBC  COMPREHENSIVE METABOLIC PANEL  TYPE AND SCREEN    Imaging Review No results found.   EKG  Interpretation None      MDM   Final diagnoses:  None    1. Lower GI bleeding  She is very well appearing. No symptoms of decompensation - normal blood pressure, no lightheadedness/dizziness/near- or full syncope. She is not in pain. Hgb stable at 11.5.  Discussed with Dr. Elnoria HowardHung who recommends admission and inpatient evaluation and treatment. Discussed with the hospitalist, Dr. Alvester MorinNewton, who accepts the patient for admission.    Arnoldo HookerShari A Keddrick Wyne, PA-C 09/22/14 [id number]  Tomasita CrumbleAdeleke Oni, MD 09/22/14 [phone]

## 2014-09-22 NOTE — ED Notes (Signed)
Resting quietly with eye closed. Easily arousable. Verbally responsive. Resp even and unlabored. ABC's intact. NAD noted.  

## 2014-09-22 NOTE — Progress Notes (Signed)
Patient was admitted earlier this morning. Please see H&P by Dr. Newton. Patient was seen and examined.  S: She feels well. Denies any abdominal pain. Did have a small bloody bowel movement this morning. Denies any nausea, vomiting.  O: Vital signs have been reviewed. Lungs are clear to auscultation bilaterally. Cardiovascular: S1, S2 is normal, regular. No S3, S4. No rubs, murmurs, or bruit. No pedal edema. Abdomen is soft, nontender, nondistended. Bowel sounds are present. No masses or organomegaly. Neurologically: She is alert and oriented 3. No focal neurological deficits appreciated.  A/P: Patient has been admitted to for rectal bleeding. Hemoglobin stable so far. She has been seen by gastroenterology and plan is for colonoscopy 11/25. Continue PPI for now. Continue monitoring hemoglobin.  Blood pressure is somewhat soft but she remains asymptomatic. We're holding her blood pressure medications. As well as her diuretics.  Continue to monitor closely. Follow plan as outlined by Dr. Newton and by Dr. Hung.  Loretta Hartman 09/22/2014 1:43 PM  

## 2014-09-22 NOTE — ED Notes (Signed)
Pt reports 2 episodes of rectal bleeding after eating dinner at 2100 yesterday.  Pt denies any abd pain at this time.  Pt reports dark red blood.

## 2014-09-22 NOTE — H&P (Signed)
Hospitalist Admission History and Physical  Patient name: Loretta Hartman Feehan Medical record number: [id number] Date of birth: 05/21/1932 Age: 80 y.o. Gender: female  Primary Care Provider: [name],[name], MD  Chief Complaint: GIB  History of Present Illness:This is a 80 y.o. year old female with significant past medical history of HTN, HLD, stage 3 CKD  presenting with GIB. Patient reports that she had to episodes of maroon-colored stools. Had 1 episode last night around 9 PM that stool looked purely bloody. Maroon color. Had another episode around 12 AM. Patient denies any prior history of this in the past. Denies any abdominal pain. No nausea or vomiting. No fevers or chills. Denies any history of colonic disease in the past. Not on Coumadin. Does take a baby aspirin daily. Also reports daily Aleve use for several years. Present to the ER MAXIMUM TEMPERATURE 98, heart rate in the 70s, respirations in the tens, blood pressure in the 110s to 120s, satting greater than 95% on room air. White blood cell count 4.3, hemoglobin 11.3, creatinine 1.48. Heme positive stools. Per ED PA, spoke with Dr. Elnoria HowardHung about the case. Recommends inpatient admission. They will formally consult.  Assessment and Plan: Loretta Hartman Enterline is a 80 y.o. year old female presenting with GIB   Active Problems:   GIB (gastrointestinal bleeding)   1- GIB -hemodynamically stable currently  -type and screen  -serial CBCs -likely 2/2 NSAID use  -hold offending agents -PPI  -follow   2- HTN -LLN BPs on presentation  -hold home meds in the interim given above.   3- CKD -cr at baseline -follow   FEN/GI: NPO for now  Prophylaxis: SCDs  Disposition: pending further evaluation  Code Status:Full Code     Patient Active Problem List   Diagnosis Date Noted  . GIB (gastrointestinal bleeding) 09/22/2014  . Acute on chronic renal failure 06/10/2014  . Rash and nonspecific skin eruption 06/10/2014  . Cellulitis of left  lower extremity-failed outpatient management 06/08/2014  . Hypertension goal BP (blood pressure) < 130/80 06/08/2014  . Metabolic acidosis, increased anion gap (IAG) 06/08/2014  . Chronic kidney disease (CKD), stage III (moderate) 06/08/2014  . Hyperlipidemia 06/08/2014   Past Medical History: Past Medical History  Diagnosis Date  . Bronchitis   . Hypertension   . Hyperlipidemia     Past Surgical History: Past Surgical History  Procedure Laterality Date  . Total knee arthroplasty      bilateral  . Cholecystectomy      Social History: History   Social History  . Marital Status: Widowed    Spouse Name: N/A    Number of Children: N/A  . Years of Education: N/A   Social History Main Topics  . Smoking status: Former Smoker -- 25 years    Quit date: 06/08/1990  . Smokeless tobacco: Current User    Types: Snuff  . Alcohol Use: No  . Drug Use: No  . Sexual Activity: None   Other Topics Concern  . None   Social History Narrative    Family History: Family History  Problem Relation Age of Onset  . Cancer Father   . Cancer Brother   . Diabetes Brother     Allergies: Allergies  Allergen Reactions  . Penicillins Anaphylaxis  . Clindamycin/Lincomycin Rash    Current Facility-Administered Medications  Medication Dose Route Frequency Provider Last Rate Last Dose  . 0.9 %  sodium chloride infusion   Intravenous Continuous Doree AlbeeSteven Coleman Kalas, MD      .  Omega 3 CAPS   Oral Daily Doree AlbeeSteven Icyss Skog, MD      . pantoprazole (PROTONIX) injection 40 mg  40 mg Intravenous Q24H Doree AlbeeSteven Feliz Herard, MD      . rosuvastatin (CRESTOR) tablet 10 mg  10 mg Oral QHS Doree AlbeeSteven Chi Garlow, MD       Current Outpatient Prescriptions  Medication Sig Dispense Refill  . aspirin EC 81 MG tablet Take 81 mg by mouth daily.    . fluorometholone (FML) 0.1 % ophthalmic suspension Place 1 drop into both eyes 2 (two) times daily.   0  . furosemide (LASIX) 40 MG tablet Take 40 mg by mouth daily.   0  . lisinopril  (PRINIVIL,ZESTRIL) 10 MG tablet Take 1 tablet (10 mg total) by mouth daily. 30 tablet 0  . Multiple Vitamin (MULTIVITAMIN WITH MINERALS) TABS tablet Take 1 tablet by mouth daily.    . naproxen sodium (ANAPROX) 220 MG tablet Take 440 mg by mouth daily.    . Omega-3 Fatty Acids (OMEGA 3 PO) Take 1 capsule by mouth daily.    . rosuvastatin (CRESTOR) 10 MG tablet Take 10 mg by mouth at bedtime.     . diphenhydrAMINE (BENADRYL) 25 mg capsule Take 1 capsule (25 mg total) by mouth every 8 (eight) hours as needed for itching or allergies. (Patient not taking: Reported on 09/22/2014) 30 capsule 0   Review Of Systems: 12 point ROS negative except as noted above in HPI.  Physical Exam: Filed Vitals:   09/22/14 0300  BP: 110/49  Pulse: 71  Temp:   Resp:     General: alert, cooperattive  HEENT: PERRLA and extra ocular movement intact Heart: S1, S2 normal, no murmur, rub or gallop, regular rate and rhythm Lungs: clear to auscultation, no wheezes or rales and unlabored breathing Abdomen: abdomen is soft without significant tenderness, masses, organomegaly or guarding Extremities: extremities normal, atraumatic, no cyanosis or edema Skin:no rashes Neurology: normal without focal findings  Labs and Imaging: Lab Results  Component Value Date/Time   NA 142 09/22/2014 03:09 AM   K 4.9 09/22/2014 03:09 AM   CL 104 09/22/2014 03:09 AM   CO2 27 09/22/2014 03:09 AM   BUN 39* 09/22/2014 03:09 AM   CREATININE 1.48* 09/22/2014 03:09 AM   GLUCOSE 121* 09/22/2014 03:09 AM   Lab Results  Component Value Date   WBC 4.3 09/22/2014   HGB 11.3* 09/22/2014   HCT 35.2* 09/22/2014   MCV 95.9 09/22/2014   PLT 259 09/22/2014    No results found.         Doree AlbeeSteven Maykayla Highley MD  Pager: [phone]

## 2014-09-22 NOTE — ED Notes (Signed)
Pt stated that she is not taking Coumadin. D/C PT/INR lab test

## 2014-09-22 NOTE — Progress Notes (Signed)
UR completed 

## 2014-09-22 NOTE — Progress Notes (Signed)
CARE MANAGEMENT NOTE 09/22/2014  Patient:  Marguerite OleaMBROSE,Anuoluwapo J   Account Number:  [id number]  Date Initiated:  09/22/2014  Documentation initiated by:  Ferdinand CavaSCHETTINO,Ferrin Liebig  Subjective/Objective Assessment:   80 yo female admitted with GI bleed     Action/Plan:   discharge planning   Anticipated DC Date:  09/25/2014   Anticipated DC Plan:  HOME W HOME HEALTH SERVICES      DC Planning Services  CM consult      Choice offered to / List presented to:             Status of service:  In process, will continue to follow Medicare Important Message given?   (If response is "NO", the following Medicare IM given date fields will be blank) Date Medicare IM given:   Medicare IM given by:   Date Additional Medicare IM given:   Additional Medicare IM given by:    Discharge Disposition:    Per UR Regulation:    If discussed at Long Length of Stay Meetings, dates discussed:    Comments:  09/22/14 Ferdinand CavaAndrea Schettino RN BSN CM [phone] Patient lives home with her daughter, Radene JourneyLaura Ann, uses a cane and uses public transportation. CM will continue to follow

## 2014-09-22 NOTE — Consult Note (Signed)
Unassigned Consult  Reason for Consult: Hematochezia Referring Physician: [facility]  Keila Cathie HoopsJ Shadle HPI: This is an 80 year old female with a PMH of hyperlipidemia and HTN who is admitted to the hospital for hematochezia.  At 9 PM last evening she had an urge to have a bowel movement and she passed a copious amount of blood.  No associated abdominal pain with the passage of blood.  A second event occurred at 12 AM and at that point she sought medical care.  No prior history of hematochezia and her last colonoscopy was 8 years ago.  Her son died of colon cancer 2 years ago at the age of 78.  On a routine basis she takes an ASA and two Aleve for her arthritis.  Currently she is stable and without any complaints.  No further bleeding since earlier this morning.  She denies any problems with chest pain or SOB.  Past Medical History  Diagnosis Date  . Bronchitis   . Hypertension   . Hyperlipidemia     Past Surgical History  Procedure Laterality Date  . Total knee arthroplasty      bilateral  . Cholecystectomy      Family History  Problem Relation Age of Onset  . Cancer Father   . Cancer Brother   . Diabetes Brother     Social History:  reports that she quit smoking about 24 years ago. Her smokeless tobacco use includes Snuff. She reports that she does not drink alcohol or use illicit drugs.  Allergies:  Allergies  Allergen Reactions  . Penicillins Anaphylaxis  . Clindamycin/Lincomycin Rash    Medications:  Scheduled: . omega-3 acid ethyl esters  1 capsule Oral Daily  . pantoprazole (PROTONIX) IV  40 mg Intravenous Q24H  . rosuvastatin  10 mg Oral QHS   Continuous: . sodium chloride 75 mL/hr (09/22/14 0520)    Results for orders placed or performed during the hospital encounter of 09/22/14 (from the past 24 hour(s))  POC occult blood, ED RN will collect     Status: Abnormal   Collection Time: 09/22/14  2:59 AM  Result Value Ref Range   Fecal Occult Bld POSITIVE  (A) NEGATIVE  CBC     Status: Abnormal   Collection Time: 09/22/14  3:09 AM  Result Value Ref Range   WBC 4.3 4.0 - 10.5 K/uL   RBC 3.67 (L) 3.87 - 5.11 MIL/uL   Hemoglobin 11.3 (L) 12.0 - 15.0 g/dL   HCT 16.135.2 (L) 09.636.0 - 04.546.0 %   MCV 95.9 78.0 - 100.0 fL   MCH 30.8 26.0 - 34.0 pg   MCHC 32.1 30.0 - 36.0 g/dL   RDW 40.913.8 81.111.5 - 91.415.5 %   Platelets 259 150 - 400 K/uL  Comprehensive metabolic panel     Status: Abnormal   Collection Time: 09/22/14  3:09 AM  Result Value Ref Range   Sodium 142 137 - 147 mEq/L   Potassium 4.9 3.7 - 5.3 mEq/L   Chloride 104 96 - 112 mEq/L   CO2 27 19 - 32 mEq/L   Glucose, Bld 121 (H) 70 - 99 mg/dL   BUN 39 (H) 6 - 23 mg/dL   Creatinine, Ser 7.821.48 (H) 0.50 - 1.10 mg/dL   Calcium 9.9 8.4 - 95.610.5 mg/dL   Total Protein 7.1 6.0 - 8.3 g/dL   Albumin 3.1 (L) 3.5 - 5.2 g/dL   AST 24 0 - 37 U/L   ALT 14 0 - 35 U/L  Alkaline Phosphatase 97 39 - 117 U/L   Total Bilirubin <0.2 (L) 0.3 - 1.2 mg/dL   GFR calc non Af Amer 32 (L) >90 mL/min   GFR calc Af Amer 37 (L) >90 mL/min   Anion gap 11 5 - 15  Type and screen for Red Blood Exchange     Status: None   Collection Time: 09/22/14  3:10 AM  Result Value Ref Range   ABO/RH(D) AB POS    Antibody Screen NEG    Sample Expiration 09/25/2014   ABO/Rh     Status: None   Collection Time: 09/22/14  4:00 AM  Result Value Ref Range   ABO/RH(D) AB POS   CBC WITH DIFFERENTIAL     Status: Abnormal   Collection Time: 09/22/14  6:06 AM  Result Value Ref Range   WBC 5.5 4.0 - 10.5 K/uL   RBC 3.56 (L) 3.87 - 5.11 MIL/uL   Hemoglobin 11.0 (L) 12.0 - 15.0 g/dL   HCT 81.133.8 (L) 91.436.0 - 78.246.0 %   MCV 94.9 78.0 - 100.0 fL   MCH 30.9 26.0 - 34.0 pg   MCHC 32.5 30.0 - 36.0 g/dL   RDW 95.613.9 21.311.5 - 08.615.5 %   Platelets 243 150 - 400 K/uL   Neutrophils Relative % 70 43 - 77 %   Neutro Abs 3.8 1.7 - 7.7 K/uL   Lymphocytes Relative 24 12 - 46 %   Lymphs Abs 1.3 0.7 - 4.0 K/uL   Monocytes Relative 6 3 - 12 %   Monocytes Absolute 0.3  0.1 - 1.0 K/uL   Eosinophils Relative 0 0 - 5 %   Eosinophils Absolute 0.0 0.0 - 0.7 K/uL   Basophils Relative 0 0 - 1 %   Basophils Absolute 0.0 0.0 - 0.1 K/uL     No results found.  ROS:  As stated above in the HPI otherwise negative.  Blood pressure 98/58, pulse 60, temperature 98 F (36.7 C), temperature source Oral, resp. rate 18, SpO2 99 %.    PE: Gen: NAD, Alert and Oriented HEENT:  [place]/AT, EOMI Neck: Supple, no LAD Lungs: CTA Bilaterally CV: RRR without M/G/R ABM: Soft, NTND, +BS Ext: No C/C/E  Assessment/Plan: 1) Hematochezia. 2) Family history of colon cancer. 3) HTN. 4) Hyperlipidemia.   The clinical presentation is consistent with a diverticular bleed, however, 12-15% of hematochezia events are from an upper GI source.  She has not had a colonoscopy in a number of years and her son recently died of colon cancer.  Further evaluation is required while she is in the hospital.  Plan: 1) Colonoscopy tomorrow. 2) Follow HGB and transfuse as necessary.  Senay Sistrunk D 09/22/2014, 6:33 AM

## 2014-09-23 ENCOUNTER — Encounter (HOSPITAL_COMMUNITY): Payer: Self-pay | Admitting: Anesthesiology

## 2014-09-23 ENCOUNTER — Encounter (HOSPITAL_COMMUNITY): Payer: Self-pay | Admitting: *Deleted

## 2014-09-23 ENCOUNTER — Encounter (HOSPITAL_COMMUNITY)
Admission: EM | Disposition: A | Discharge status: Home / Self Care | Payer: Self-pay | Source: Home / Self Care | Attending: Emergency Medicine

## 2014-09-23 DIAGNOSIS — I10 Essential (primary) hypertension: Secondary | ICD-10-CM

## 2014-09-23 DIAGNOSIS — K5791 Diverticulosis of intestine, part unspecified, without perforation or abscess with bleeding: Secondary | ICD-10-CM | POA: Diagnosis not present

## 2014-09-23 DIAGNOSIS — N183 Chronic kidney disease, stage 3 (moderate): Secondary | ICD-10-CM

## 2014-09-23 HISTORY — PX: COLONOSCOPY WITH PROPOFOL: SHX5780

## 2014-09-23 LAB — CBC WITH DIFFERENTIAL/PLATELET
Basophils Absolute: 0 10*3/uL (ref 0.0–0.1)
Basophils Relative: 0 % (ref 0–1)
EOS PCT: 3 % (ref 0–5)
Eosinophils Absolute: 0.1 10*3/uL (ref 0.0–0.7)
HEMATOCRIT: 28.7 % — AB (ref 36.0–46.0)
HEMOGLOBIN: 9.2 g/dL — AB (ref 12.0–15.0)
LYMPHS ABS: 1.7 10*3/uL (ref 0.7–4.0)
Lymphocytes Relative: 40 % (ref 12–46)
MCH: 30.6 pg (ref 26.0–34.0)
MCHC: 32.1 g/dL (ref 30.0–36.0)
MCV: 95.3 fL (ref 78.0–100.0)
MONO ABS: 0.2 10*3/uL (ref 0.1–1.0)
Monocytes Relative: 5 % (ref 3–12)
Neutro Abs: 2.2 10*3/uL (ref 1.7–7.7)
Neutrophils Relative %: 52 % (ref 43–77)
Platelets: 218 10*3/uL (ref 150–400)
RBC: 3.01 MIL/uL — AB (ref 3.87–5.11)
RDW: 14 % (ref 11.5–15.5)
WBC: 4.3 10*3/uL (ref 4.0–10.5)

## 2014-09-23 LAB — COMPREHENSIVE METABOLIC PANEL WITH GFR
ALT: 11 U/L (ref 0–35)
AST: 13 U/L (ref 0–37)
Albumin: 2.8 g/dL — ABNORMAL LOW (ref 3.5–5.2)
Alkaline Phosphatase: 91 U/L (ref 39–117)
Anion gap: 10 (ref 5–15)
BUN: 28 mg/dL — ABNORMAL HIGH (ref 6–23)
CO2: 26 meq/L (ref 19–32)
Calcium: 9.1 mg/dL (ref 8.4–10.5)
Chloride: 107 meq/L (ref 96–112)
Creatinine, Ser: 1.17 mg/dL — ABNORMAL HIGH (ref 0.50–1.10)
GFR calc Af Amer: 50 mL/min — ABNORMAL LOW
GFR calc non Af Amer: 43 mL/min — ABNORMAL LOW
Glucose, Bld: 100 mg/dL — ABNORMAL HIGH (ref 70–99)
Potassium: 5.2 meq/L (ref 3.7–5.3)
Sodium: 143 meq/L (ref 137–147)
Total Bilirubin: 0.3 mg/dL (ref 0.3–1.2)
Total Protein: 5.9 g/dL — ABNORMAL LOW (ref 6.0–8.3)

## 2014-09-23 SURGERY — COLONOSCOPY WITH PROPOFOL
Anesthesia: Monitor Anesthesia Care

## 2014-09-23 MED ORDER — FENTANYL CITRATE 0.05 MG/ML IJ SOLN
INTRAMUSCULAR | Status: DC | PRN
  Administered 2014-09-23: 25 ug via INTRAVENOUS

## 2014-09-23 MED ORDER — PANTOPRAZOLE SODIUM 40 MG PO TBEC
40.0000 mg | DELAYED_RELEASE_TABLET | Freq: Every day | ORAL | Status: DC
  Administered 2014-09-23 – 2014-09-24 (×2): 40 mg via ORAL
  Filled 2014-09-23: qty 1

## 2014-09-23 MED ORDER — POLYETHYLENE GLYCOL 3350 17 G PO PACK
17.0000 g | PACK | Freq: Every day | ORAL | Status: DC
  Administered 2014-09-23 – 2014-09-24 (×2): 17 g via ORAL
  Filled 2014-09-23 (×2): qty 1

## 2014-09-23 MED ORDER — DIPHENHYDRAMINE HCL 50 MG/ML IJ SOLN
INTRAMUSCULAR | Status: AC
  Filled 2014-09-23: qty 1

## 2014-09-23 MED ORDER — MIDAZOLAM HCL 10 MG/2ML IJ SOLN
INTRAMUSCULAR | Status: DC | PRN
  Administered 2014-09-23: 1 mg via INTRAVENOUS
  Administered 2014-09-23: 2 mg via INTRAVENOUS

## 2014-09-23 MED ORDER — FENTANYL CITRATE 0.05 MG/ML IJ SOLN
INTRAMUSCULAR | Status: AC
  Filled 2014-09-23: qty 2

## 2014-09-23 MED ORDER — MIDAZOLAM HCL 10 MG/2ML IJ SOLN
INTRAMUSCULAR | Status: AC
  Filled 2014-09-23: qty 2

## 2014-09-23 SURGICAL SUPPLY — 22 items

## 2014-09-23 NOTE — Progress Notes (Signed)
Pt. Tolerated stool prep fine has had multiple loose bloody stool since starting prep. Pt has voiced no c/o pain currently remains NPO for colonoscopy today will continue to monitor.

## 2014-09-23 NOTE — Progress Notes (Signed)
UR completed 

## 2014-09-23 NOTE — H&P (View-Only) (Signed)
Patient was admitted earlier this morning. Please see H&P by Dr. Alvester MorinNewton. Patient was seen and examined.  S: She feels well. Denies any abdominal pain. Did have a small bloody bowel movement this morning. Denies any nausea, vomiting.  O: Vital signs have been reviewed. Lungs are clear to auscultation bilaterally. Cardiovascular: S1, S2 is normal, regular. No S3, S4. No rubs, murmurs, or bruit. No pedal edema. Abdomen is soft, nontender, nondistended. Bowel sounds are present. No masses or organomegaly. Neurologically: She is alert and oriented 3. No focal neurological deficits appreciated.  A/P: Patient has been admitted to for rectal bleeding. Hemoglobin stable so far. She has been seen by gastroenterology and plan is for colonoscopy 11/25. Continue PPI for now. Continue monitoring hemoglobin.  Blood pressure is somewhat soft but she remains asymptomatic. We're holding her blood pressure medications. As well as her diuretics.  Continue to monitor closely. Follow plan as outlined by Dr. Alvester MorinNewton and by Dr. Elnoria HowardHung.  Loretta Hartman 09/22/2014 1:43 PM

## 2014-09-23 NOTE — Progress Notes (Signed)
[facility]aren [facility]. Pt arrived unit, alert and oriented, will continue with current plan of care.

## 2014-09-23 NOTE — Progress Notes (Addendum)
[facility] PROGRESS NOTE  Loretta Hartman WUJ:[id number] DOB: 03/29/1932 DOA: 09/22/2014 PCP: [name],[name], MD  Assessment/Plan: Active Problems:   Hypertension goal BP (blood pressure) < 130/80   Chronic kidney disease (CKD), stage III (moderate)   GIB (gastrointestinal bleeding)   Lower GI bleed   Acute blood loss anemia     1- GIB -hemodynamically stable currently  -type and screen  -serial CBCs stable  -likely 2/2 NSAID use  -PPI  -follow cbc  Colonoscopy shows bleeding diverticula, Bleeding scan and transfuse if bleeding recurs   2- HTN -LLN BPs on presentation  -hold home meds in the interim given above.   3- CKD -cr at baseline -follow    Code Status: full Family Communication: family updated about patient's clinical progress Disposition Plan:  As above    Brief narrative: This is an 80 year old female with a PMH of hyperlipidemia and HTN who is admitted to the hospital for hematochezia. At 9 PM last evening she had an urge to have a bowel movement and she passed a copious amount of blood. No associated abdominal pain with the passage of blood. A second event occurred at 12 AM and at that point she sought medical care. No prior history of hematochezia and her last colonoscopy was 8 years ago. Her son died of colon cancer 2 years ago at the age of 78. On a routine basis she takes an ASA and two Aleve for her arthritis. Currently she is stable and without any complaints. No further bleeding since earlier this morning. She denies any problems with chest pain or SOB. Consultants:  GI  Procedures:  COLONOSCOPY  Antibiotics:  NONE   HPI/Subjective: NO FURTHER BLEEDING   Objective: Filed Vitals:   09/23/14 1201 09/23/14 1210 09/23/14 1220 09/23/14 1225  BP: 143/44 113/47 126/52   Pulse: 73 62 62 61  Temp:      TempSrc:      Resp: 16 13 19 12   Height:      Weight:      SpO2: 100% 100% 100% 100%    Intake/Output Summary  (Last 24 hours) at 09/23/14 1240 Last data filed at 09/23/14 0524  Gross per 24 hour  Intake   1805 ml  Output      0 ml  Net   1805 ml    Exam:  General: alert & oriented x 3 In NAD  Cardiovascular: RRR, nl S1 s2  Respiratory: Decreased breath sounds at the bases, scattered rhonchi, no crackles  Abdomen: soft +BS NT/ND, no masses palpable  Extremities: No cyanosis and no edema      Data Reviewed: Basic Metabolic Panel:  Recent Labs Lab 09/22/14 0309 09/23/14 0519  NA 142 143  K 4.9 5.2  CL 104 107  CO2 27 26  GLUCOSE 121* 100*  BUN 39* 28*  CREATININE 1.48* 1.17*  CALCIUM 9.9 9.1    Liver Function Tests:  Recent Labs Lab 09/22/14 0309 09/23/14 0519  AST 24 13  ALT 14 11  ALKPHOS 97 91  BILITOT <0.2* 0.3  PROT 7.1 5.9*  ALBUMIN 3.1* 2.8*   No results for input(s): LIPASE, AMYLASE in the last 168 hours. No results for input(s): AMMONIA in the last 168 hours.  CBC:  Recent Labs Lab 09/22/14 0309 09/22/14 0606 09/22/14 1402 09/22/14 2210 09/23/14 0519  WBC 4.3 5.5 4.8 5.2 4.3  NEUTROABS  --  3.8 2.7 2.4 2.2  HGB 11.3* 11.0* 10.7* 11.0* 9.2*  HCT 35.2* 33.8*  33.4* 35.1* 28.7*  MCV 95.9 94.9 96.3 97.0 95.3  PLT 259 243 232 239 218    Cardiac Enzymes: No results for input(s): CKTOTAL, CKMB, CKMBINDEX, TROPONINI in the last 168 hours. BNP (last 3 results)  Recent Labs  06/08/14 1537  PROBNP 453.9*     CBG: No results for input(s): GLUCAP in the last 168 hours.  No results found for this or any previous visit (from the past 240 hour(s)).   Studies: No results found.  Scheduled Meds: . omega-3 acid ethyl esters  1 capsule Oral Daily  . pantoprazole (PROTONIX) IV  40 mg Intravenous Q24H  . rosuvastatin  10 mg Oral QHS   Continuous Infusions: . sodium chloride 75 mL/hr at 09/22/14 2318  . sodium chloride      Active Problems:   Hypertension goal BP (blood pressure) < 130/80   Chronic kidney disease (CKD), stage III (moderate)    GIB (gastrointestinal bleeding)   Lower GI bleed   Acute blood loss anemia     Time spent: 40 minutes   [facility] SystemBROL,Tyese Finken  [facility] Pager [phone]. If 7PM-7AM, please contact night-coverage at [email or web address], password [email or web address] 09/23/2014, 12:40 PM  LOS: 1 day

## 2014-09-23 NOTE — Interval H&P Note (Signed)
History and Physical Interval Note:  09/23/2014 11:02 AM  Loretta Hartman  has presented today for surgery, with the diagnosis of Hematochezia  The various methods of treatment have been discussed with the patient and family. After consideration of risks, benefits and other options for treatment, the patient has consented to  Procedure(s): COLONOSCOPY WITH PROPOFOL (N/A) as a surgical intervention .  The patient's history has been reviewed, patient examined, no change in status, stable for surgery.  I have reviewed the patient's chart and labs.  Questions were answered to the patient's satisfaction.     Donnisha Besecker D

## 2014-09-24 DIAGNOSIS — K5791 Diverticulosis of intestine, part unspecified, without perforation or abscess with bleeding: Secondary | ICD-10-CM | POA: Diagnosis not present

## 2014-09-24 LAB — CBC
HEMATOCRIT: 27.3 % — AB (ref 36.0–46.0)
Hemoglobin: 8.8 g/dL — ABNORMAL LOW (ref 12.0–15.0)
MCH: 31 pg (ref 26.0–34.0)
MCHC: 32.2 g/dL (ref 30.0–36.0)
MCV: 96.1 fL (ref 78.0–100.0)
Platelets: 201 10*3/uL (ref 150–400)
RBC: 2.84 MIL/uL — ABNORMAL LOW (ref 3.87–5.11)
RDW: 13.8 % (ref 11.5–15.5)
WBC: 4.8 10*3/uL (ref 4.0–10.5)

## 2014-09-24 LAB — COMPREHENSIVE METABOLIC PANEL
ALBUMIN: 2.8 g/dL — AB (ref 3.5–5.2)
ALK PHOS: 90 U/L (ref 39–117)
ALT: 11 U/L (ref 0–35)
AST: 17 U/L (ref 0–37)
Anion gap: 12 (ref 5–15)
BILIRUBIN TOTAL: 0.3 mg/dL (ref 0.3–1.2)
BUN: 19 mg/dL (ref 6–23)
CHLORIDE: 106 meq/L (ref 96–112)
CO2: 22 mEq/L (ref 19–32)
Calcium: 8.9 mg/dL (ref 8.4–10.5)
Creatinine, Ser: 1.08 mg/dL (ref 0.50–1.10)
GFR calc Af Amer: 55 mL/min — ABNORMAL LOW (ref >90)
GFR calc non Af Amer: 47 mL/min — ABNORMAL LOW (ref >90)
Glucose, Bld: 106 mg/dL — ABNORMAL HIGH (ref 70–99)
POTASSIUM: 4.3 meq/L (ref 3.7–5.3)
Sodium: 140 mEq/L (ref 137–147)
Total Protein: 6.2 g/dL (ref 6.0–8.3)

## 2014-09-24 MED ORDER — PANTOPRAZOLE SODIUM 40 MG PO TBEC: 40.0000 mg | DELAYED_RELEASE_TABLET | Freq: Every day | ORAL | Status: AC

## 2014-09-24 NOTE — Discharge Summary (Addendum)
Physician Discharge Summary  Loretta Hartman MRN: [id number] DOB/AGE: Mar 19, 1932 80 y.o.  PCP: Dwan Bolt, MD   Admit date: 09/22/2014 Discharge date: 09/24/2014  Discharge Diagnoses:   Diverticular bleed   Hypertension goal BP (blood pressure) < 130/80   Chronic kidney disease (CKD), stage III (moderate)   GIB (gastrointestinal bleeding)   Lower GI bleed   Acute blood loss anemia   Follow up recommendations Follow up with GI , dr hung in 2 weeks  pcp in 5-7 days     Medication List    STOP taking these medications        lisinopril 10 MG tablet  Commonly known as:  PRINIVIL,ZESTRIL     naproxen sodium 220 MG tablet  Commonly known as:  ANAPROX      TAKE these medications        aspirin EC 81 MG tablet  Take 81 mg by mouth daily.     fluorometholone 0.1 % ophthalmic suspension  Commonly known as:  FML  Place 1 drop into both eyes 2 (two) times daily.     furosemide 40 MG tablet  Commonly known as:  LASIX  Take 40 mg by mouth daily.     multivitamin with minerals Tabs tablet  Take 1 tablet by mouth daily.     OMEGA 3 PO  Take 1 capsule by mouth daily.     pantoprazole 40 MG tablet  Commonly known as:  PROTONIX  Take 1 tablet (40 mg total) by mouth daily.     rosuvastatin 10 MG tablet  Commonly known as:  CRESTOR  Take 10 mg by mouth at bedtime.        Discharge Condition: stable  Disposition: 01-Home or Self Care   Consults Stable   Significant Diagnostic Studies: No results found.    Microbiology: No results found for this or any previous visit (from the past 240 hour(s)).   Labs: Results for orders placed or performed during the hospital encounter of 09/22/14 (from the past 48 hour(s))  CBC WITH DIFFERENTIAL     Status: Abnormal   Collection Time: 09/22/14  2:02 PM  Result Value Ref Range   WBC 4.8 4.0 - 10.5 K/uL   RBC 3.47 (L) 3.87 - 5.11 MIL/uL   Hemoglobin 10.7 (L) 12.0 - 15.0 g/dL   HCT 33.4 (L) 36.0 - 46.0 %    MCV 96.3 78.0 - 100.0 fL   MCH 30.8 26.0 - 34.0 pg   MCHC 32.0 30.0 - 36.0 g/dL   RDW 14.0 11.5 - 15.5 %   Platelets 232 150 - 400 K/uL   Neutrophils Relative % 57 43 - 77 %   Neutro Abs 2.7 1.7 - 7.7 K/uL   Lymphocytes Relative 35 12 - 46 %   Lymphs Abs 1.7 0.7 - 4.0 K/uL   Monocytes Relative 7 3 - 12 %   Monocytes Absolute 0.3 0.1 - 1.0 K/uL   Eosinophils Relative 1 0 - 5 %   Eosinophils Absolute 0.1 0.0 - 0.7 K/uL   Basophils Relative 0 0 - 1 %   Basophils Absolute 0.0 0.0 - 0.1 K/uL   WBC Morphology WHITE COUNT CONFIRMED ON SMEAR    RBC Morphology RARE NRBCs   CBC WITH DIFFERENTIAL     Status: Abnormal   Collection Time: 09/22/14 10:10 PM  Result Value Ref Range   WBC 5.2 4.0 - 10.5 K/uL   RBC 3.62 (L) 3.87 - 5.11 MIL/uL   Hemoglobin 11.0 (  L) 12.0 - 15.0 g/dL   HCT 35.1 (L) 36.0 - 46.0 %   MCV 97.0 78.0 - 100.0 fL   MCH 30.4 26.0 - 34.0 pg   MCHC 31.3 30.0 - 36.0 g/dL   RDW 14.0 11.5 - 15.5 %   Platelets 239 150 - 400 K/uL   Neutrophils Relative % 46 43 - 77 %   Neutro Abs 2.4 1.7 - 7.7 K/uL   Lymphocytes Relative 44 12 - 46 %   Lymphs Abs 2.3 0.7 - 4.0 K/uL   Monocytes Relative 7 3 - 12 %   Monocytes Absolute 0.4 0.1 - 1.0 K/uL   Eosinophils Relative 3 0 - 5 %   Eosinophils Absolute 0.2 0.0 - 0.7 K/uL   Basophils Relative 0 0 - 1 %   Basophils Absolute 0.0 0.0 - 0.1 K/uL  Comprehensive metabolic panel     Status: Abnormal   Collection Time: 09/23/14  5:19 AM  Result Value Ref Range   Sodium 143 137 - 147 mEq/L   Potassium 5.2 3.7 - 5.3 mEq/L   Chloride 107 96 - 112 mEq/L   CO2 26 19 - 32 mEq/L   Glucose, Bld 100 (H) 70 - 99 mg/dL   BUN 28 (H) 6 - 23 mg/dL   Creatinine, Ser 1.17 (H) 0.50 - 1.10 mg/dL   Calcium 9.1 8.4 - 10.5 mg/dL   Total Protein 5.9 (L) 6.0 - 8.3 g/dL   Albumin 2.8 (L) 3.5 - 5.2 g/dL   AST 13 0 - 37 U/L   ALT 11 0 - 35 U/L   Alkaline Phosphatase 91 39 - 117 U/L   Total Bilirubin 0.3 0.3 - 1.2 mg/dL   GFR calc non Af Amer 43 (L) >90  mL/min   GFR calc Af Amer 50 (L) >90 mL/min    Comment: (NOTE) The eGFR has been calculated using the CKD EPI equation. This calculation has not been validated in all clinical situations. eGFR's persistently <90 mL/min signify possible Chronic Kidney Disease.    Anion gap 10 5 - 15  CBC WITH DIFFERENTIAL     Status: Abnormal   Collection Time: 09/23/14  5:19 AM  Result Value Ref Range   WBC 4.3 4.0 - 10.5 K/uL   RBC 3.01 (L) 3.87 - 5.11 MIL/uL   Hemoglobin 9.2 (L) 12.0 - 15.0 g/dL   HCT 28.7 (L) 36.0 - 46.0 %   MCV 95.3 78.0 - 100.0 fL   MCH 30.6 26.0 - 34.0 pg   MCHC 32.1 30.0 - 36.0 g/dL   RDW 14.0 11.5 - 15.5 %   Platelets 218 150 - 400 K/uL   Neutrophils Relative % 52 43 - 77 %   Neutro Abs 2.2 1.7 - 7.7 K/uL   Lymphocytes Relative 40 12 - 46 %   Lymphs Abs 1.7 0.7 - 4.0 K/uL   Monocytes Relative 5 3 - 12 %   Monocytes Absolute 0.2 0.1 - 1.0 K/uL   Eosinophils Relative 3 0 - 5 %   Eosinophils Absolute 0.1 0.0 - 0.7 K/uL   Basophils Relative 0 0 - 1 %   Basophils Absolute 0.0 0.0 - 0.1 K/uL  Comprehensive metabolic panel     Status: Abnormal   Collection Time: 09/24/14  5:15 AM  Result Value Ref Range   Sodium 140 137 - 147 mEq/L   Potassium 4.3 3.7 - 5.3 mEq/L    Comment: DELTA CHECK NOTED REPEATED TO VERIFY    Chloride 106 96 -  112 mEq/L   CO2 22 19 - 32 mEq/L   Glucose, Bld 106 (H) 70 - 99 mg/dL   BUN 19 6 - 23 mg/dL   Creatinine, Ser 1.08 0.50 - 1.10 mg/dL   Calcium 8.9 8.4 - 10.5 mg/dL   Total Protein 6.2 6.0 - 8.3 g/dL   Albumin 2.8 (L) 3.5 - 5.2 g/dL   AST 17 0 - 37 U/L    Comment: SLIGHT HEMOLYSIS HEMOLYSIS AT THIS LEVEL MAY AFFECT RESULT    ALT 11 0 - 35 U/L   Alkaline Phosphatase 90 39 - 117 U/L   Total Bilirubin 0.3 0.3 - 1.2 mg/dL   GFR calc non Af Amer 47 (L) >90 mL/min   GFR calc Af Amer 55 (L) >90 mL/min    Comment: (NOTE) The eGFR has been calculated using the CKD EPI equation. This calculation has not been validated in all clinical  situations. eGFR's persistently <90 mL/min signify possible Chronic Kidney Disease.    Anion gap 12 5 - 15  CBC     Status: Abnormal   Collection Time: 09/24/14  5:15 AM  Result Value Ref Range   WBC 4.8 4.0 - 10.5 K/uL   RBC 2.84 (L) 3.87 - 5.11 MIL/uL   Hemoglobin 8.8 (L) 12.0 - 15.0 g/dL   HCT 27.3 (L) 36.0 - 46.0 %   MCV 96.1 78.0 - 100.0 fL   MCH 31.0 26.0 - 34.0 pg   MCHC 32.2 30.0 - 36.0 g/dL   RDW 13.8 11.5 - 15.5 %   Platelets 201 150 - 400 K/uL     HPI  80 year old female with a PMH of hyperlipidemia and HTN who is admitted to the hospital for hematochezia. At 9 PM last evening she had an urge to have a bowel movement and she passed a copious amount of blood. No associated abdominal pain with the passage of blood. A second event occurred at 12 AM and at that point she sought medical care. No prior history of hematochezia and her last colonoscopy was 8 years ago. Her son died of colon cancer 2 years ago at the age of 76. On a routine basis she takes an ASA and two Aleve for her arthritis. Currently she is stable and without any complaints. No further bleeding since earlier this morning. She denies any problems with chest pain or SOB.   HOSPITAL COURSE: 1- GIB -hemodynamically stable currently  -serial CBCs stable  -likely 2/2 NSAID use  -PPI  -follow cbc outpt Colonoscopy shows bleeding diverticula, The patient is stable. At 12 PM it will be a little over 24 hours since her last blood bowel movement.Per Dr Benson [place] can be discharged home.    2- HTN -Low  BPs on presentation  Resume lasix and hold ACE until seen by PCP    3- CKD -cr at baseline -follow     Discharge Exam: *   Blood pressure 118/67, pulse 71, temperature 98 F (36.7 C), temperature source Oral, resp. rate 18, height _0  (1.549 m), weight 87.998 kg (194 lb), SpO2 100 %. Gen: NAD, Alert and Oriented HEENT: /AT, EOMI Neck: Supple, no LAD Lungs: CTA Bilaterally CV: RRR without  M/G/R ABM: Soft, NTND, +BS Ext: No C/C/        Discharge Instructions    Diet - low sodium heart healthy    Complete by:  As directed      Increase activity slowly    Complete by:  As directed  Follow-up Information    Follow up with Dwan Bolt, MD. Schedule an appointment as soon as possible for a visit in 1 week.   Specialty:  Endocrinology   Contact information:   [address] [place] [address] [phone]       Signed: Reyne Dumas 09/24/2014, 1:11 PM

## 2014-09-24 NOTE — Progress Notes (Signed)
UR completed 

## 2014-09-24 NOTE — Progress Notes (Signed)
Subjective: No acute events.  No further bleeding for almost 24 hours.  Objective: Vital signs in last 24 hours: Temp:  [97.9 F (36.6 C)-98 F (36.7 C)] 98 F (36.7 C) (11/26 0459) Pulse Rate:  [58-90] 71 (11/26 0459) Resp:  [12-23] 18 (11/26 0459) BP: (113-168)/(44-78) 118/67 mmHg (11/26 0459) SpO2:  [97 %-100 %] 100 % (11/26 0459) Weight:  [87.998 kg (194 lb)] 87.998 kg (194 lb) (11/25 1039) Last BM Date: 09/23/14  Intake/Output from previous day: 11/25 0701 - 11/26 0700 In: 2012.5 [P.O.:240; I.V.:1772.5] Out: -  Intake/Output this shift:    General appearance: alert and no distress GI: soft, non-tender; bowel sounds normal; no masses,  no organomegaly  Lab Results:  Recent Labs  09/22/14 2210 09/23/14 0519 09/24/14 0515  WBC 5.2 4.3 4.8  HGB 11.0* 9.2* 8.8*  HCT 35.1* 28.7* 27.3*  PLT 239 218 201   BMET  Recent Labs  09/22/14 0309 09/23/14 0519 09/24/14 0515  NA 142 143 140  K 4.9 5.2 4.3  CL 104 107 106  CO2 27 26 22   GLUCOSE 121* 100* 106*  BUN 39* 28* 19  CREATININE 1.48* 1.17* 1.08  CALCIUM 9.9 9.1 8.9   LFT  Recent Labs  09/24/14 0515  PROT 6.2  ALBUMIN 2.8*  AST 17  ALT 11  ALKPHOS 90  BILITOT 0.3   PT/INR No results for input(s): LABPROT, INR in the last 72 hours. Hepatitis Panel No results for input(s): HEPBSAG, HCVAB, HEPAIGM, HEPBIGM in the last 72 hours. C-Diff No results for input(s): CDIFFTOX in the last 72 hours. Fecal Lactopherrin No results for input(s): FECLLACTOFRN in the last 72 hours.  Studies/Results: No results found.  Medications:  Scheduled: . omega-3 acid ethyl esters  1 capsule Oral Daily  . pantoprazole  40 mg Oral Daily  . polyethylene glycol  17 g Oral Daily  . rosuvastatin  10 mg Oral QHS   Continuous: . sodium chloride 75 mL/hr at 09/23/14 1747    Assessment/Plan: 1) Diverticular bleed. 2) Anemia.   The patient is stable.  At 12 PM it will be a little over 24 hours since her last blood  bowel movement.  At that time point I think she can be discharged home.   Plan: 1) If no further bleeding by 12 PM okay to D/C home. 2) She can follow up in two weeks.  LOS: 2 days   Loretta Hartman D 09/24/2014, 8:25 AM

## 2014-09-24 NOTE — Plan of Care (Signed)
Problem: Discharge Progression Outcomes Goal: Discharge plan in place and appropriate Outcome: Completed/Met Date Met:  09/24/14 Goal: Pain controlled with appropriate interventions Outcome: Not Applicable Date Met:  09/81/19 Goal: Hemodynamically stable Outcome: Completed/Met Date Met:  14/78/29 Goal: Complications resolved/controlled Outcome: Adequate for Discharge Goal: Tolerating diet Outcome: Completed/Met Date Met:  09/24/14 Goal: Activity appropriate for discharge plan Outcome: Completed/Met Date Met:  09/24/14 Goal: Other Discharge Outcomes/Goals Outcome: Completed/Met Date Met:  09/24/14

## 2014-09-28 ENCOUNTER — Encounter (HOSPITAL_COMMUNITY): Payer: Self-pay | Admitting: Emergency Medicine

## 2014-09-28 ENCOUNTER — Inpatient Hospital Stay (HOSPITAL_COMMUNITY)
Admission: EM | Admit: 2014-09-28 | Discharge: 2014-10-03 | DRG: 378 | Disposition: A | Discharge status: Home / Self Care | Payer: PRIVATE HEALTH INSURANCE | Attending: Internal Medicine | Admitting: Internal Medicine

## 2014-09-28 DIAGNOSIS — Z87891 Personal history of nicotine dependence: Secondary | ICD-10-CM

## 2014-09-28 DIAGNOSIS — E785 Hyperlipidemia, unspecified: Secondary | ICD-10-CM | POA: Diagnosis present

## 2014-09-28 DIAGNOSIS — K625 Hemorrhage of anus and rectum: Secondary | ICD-10-CM | POA: Diagnosis present

## 2014-09-28 DIAGNOSIS — Z79899 Other long term (current) drug therapy: Secondary | ICD-10-CM

## 2014-09-28 DIAGNOSIS — N189 Chronic kidney disease, unspecified: Secondary | ICD-10-CM

## 2014-09-28 DIAGNOSIS — I10 Essential (primary) hypertension: Secondary | ICD-10-CM | POA: Diagnosis present

## 2014-09-28 DIAGNOSIS — N179 Acute kidney failure, unspecified: Secondary | ICD-10-CM | POA: Diagnosis present

## 2014-09-28 DIAGNOSIS — D62 Acute posthemorrhagic anemia: Secondary | ICD-10-CM | POA: Diagnosis present

## 2014-09-28 DIAGNOSIS — M130 Polyarthritis, unspecified: Secondary | ICD-10-CM | POA: Diagnosis present

## 2014-09-28 DIAGNOSIS — N183 Chronic kidney disease, stage 3 unspecified: Secondary | ICD-10-CM | POA: Diagnosis present

## 2014-09-28 DIAGNOSIS — K5791 Diverticulosis of intestine, part unspecified, without perforation or abscess with bleeding: Principal | ICD-10-CM | POA: Diagnosis present

## 2014-09-28 DIAGNOSIS — Z96653 Presence of artificial knee joint, bilateral: Secondary | ICD-10-CM | POA: Diagnosis present

## 2014-09-28 DIAGNOSIS — Z7982 Long term (current) use of aspirin: Secondary | ICD-10-CM

## 2014-09-28 DIAGNOSIS — I129 Hypertensive chronic kidney disease with stage 1 through stage 4 chronic kidney disease, or unspecified chronic kidney disease: Secondary | ICD-10-CM | POA: Diagnosis present

## 2014-09-28 DIAGNOSIS — K922 Gastrointestinal hemorrhage, unspecified: Secondary | ICD-10-CM | POA: Diagnosis present

## 2014-09-28 LAB — APTT: APTT: 24 s (ref 24–37)

## 2014-09-28 LAB — CBC WITH DIFFERENTIAL/PLATELET
BASOS PCT: 0 % (ref 0–1)
Basophils Absolute: 0 10*3/uL (ref 0.0–0.1)
EOS ABS: 0.1 10*3/uL (ref 0.0–0.7)
Eosinophils Relative: 2 % (ref 0–5)
HCT: 26.6 % — ABNORMAL LOW (ref 36.0–46.0)
Hemoglobin: 8.7 g/dL — ABNORMAL LOW (ref 12.0–15.0)
Lymphocytes Relative: 25 % (ref 12–46)
Lymphs Abs: 1.4 10*3/uL (ref 0.7–4.0)
MCH: 30.9 pg (ref 26.0–34.0)
MCHC: 32.7 g/dL (ref 30.0–36.0)
MCV: 94.3 fL (ref 78.0–100.0)
MONOS PCT: 7 % (ref 3–12)
Monocytes Absolute: 0.4 10*3/uL (ref 0.1–1.0)
NEUTROS PCT: 66 % (ref 43–77)
Neutro Abs: 3.7 10*3/uL (ref 1.7–7.7)
Platelets: 286 10*3/uL (ref 150–400)
RBC: 2.82 MIL/uL — ABNORMAL LOW (ref 3.87–5.11)
RDW: 14.3 % (ref 11.5–15.5)
WBC: 5.6 10*3/uL (ref 4.0–10.5)

## 2014-09-28 LAB — PROTIME-INR
INR: 1.03 (ref 0.00–1.49)
PROTHROMBIN TIME: 13.7 s (ref 11.6–15.2)

## 2014-09-28 MED ORDER — SODIUM CHLORIDE 0.9 % IV SOLN
1000.0000 mL | INTRAVENOUS | Status: DC
  Administered 2014-09-29: 1000 mL via INTRAVENOUS

## 2014-09-28 NOTE — ED Notes (Signed)
Pt states she is having rectal bleeding  Pt states she has had 2 episodes of it tonight  Pt states she was seen here last Monday night for the same  Pt states she had a dr appt tomorrow for follow up but it started again tonight

## 2014-09-28 NOTE — ED Provider Notes (Signed)
CSN: [id number]     Arrival date & time 09/28/14  2201 History   First MD Initiated Contact with Patient 09/28/14 2216     Chief Complaint  Patient presents with  . Rectal Bleeding    HPI Pt had two episodes of rectal bleeding at home this am.  When she arrived here she had an other episode.  She was in the hospital earlier this month for a diverticular bleed.  She had been doing well since discharge with normal appetite, no further bleeding or pain, and today she started having bleeding again.  Pt stopped aspirin when she was in the hospital  She did start that again when she left. Past Medical History  Diagnosis Date  . Bronchitis   . Hypertension   . Hyperlipidemia    Past Surgical History  Procedure Laterality Date  . Total knee arthroplasty      bilateral  . Cholecystectomy     Family History  Problem Relation Age of Onset  . Cancer Father   . Cancer Brother   . Diabetes Brother    History  Substance Use Topics  . Smoking status: Former Smoker -- 25 years    Quit date: 06/08/1990  . Smokeless tobacco: Current User    Types: Snuff  . Alcohol Use: No   OB History    No data available     Review of Systems  All other systems reviewed and are negative.     Allergies  Penicillins and Clindamycin/lincomycin  Home Medications   Prior to Admission medications   Medication Sig Start Date End Date Taking? Authorizing Provider  aspirin EC 81 MG tablet Take 81 mg by mouth daily.    Historical Provider, MD  fluorometholone (FML) 0.1 % ophthalmic suspension Place 1 drop into both eyes 2 (two) times daily.  07/13/14   Historical Provider, MD  furosemide (LASIX) 40 MG tablet Take 40 mg by mouth daily.  09/02/14   Historical Provider, MD  Multiple Vitamin (MULTIVITAMIN WITH MINERALS) TABS tablet Take 1 tablet by mouth daily.    Historical Provider, MD  Omega-3 Fatty Acids (OMEGA 3 PO) Take 1 capsule by mouth daily.    Historical Provider, MD  pantoprazole (PROTONIX) 40 MG  tablet Take 1 tablet (40 mg total) by mouth daily. 09/24/14   Richarda OverlieNayana Abrol, MD  rosuvastatin (CRESTOR) 10 MG tablet Take 10 mg by mouth at bedtime.     Historical Provider, MD   BP 142/68 mmHg  Pulse 99  Temp(Src) 97.8 F (36.6 C) (Oral)  Resp 18  SpO2 98% Physical Exam  Constitutional: No distress.  Obese   HENT:  Head: Normocephalic and atraumatic.  Right Ear: External ear normal.  Left Ear: External ear normal.  Eyes: Conjunctivae are normal. Right eye exhibits no discharge. Left eye exhibits no discharge. No scleral icterus.  Neck: Neck supple. No tracheal deviation present.  Cardiovascular: Normal rate, regular rhythm and intact distal pulses.   Pulmonary/Chest: Effort normal and breath sounds normal. No stridor. No respiratory distress. She has no wheezes. She has no rales.  Abdominal: Soft. Bowel sounds are normal. She exhibits no distension. There is no tenderness. There is no rebound and no guarding.  Genitourinary:  Blood tinged mucus on rectal exam  Musculoskeletal: She exhibits no edema or tenderness.  Neurological: She is alert. She has normal strength. No cranial nerve deficit (no facial droop, extraocular movements intact, no slurred speech) or sensory deficit. She exhibits normal muscle tone. She displays no seizure  activity. Coordination normal.  Skin: Skin is warm and dry. No rash noted.  Psychiatric: She has a normal mood and affect.  Nursing note and vitals reviewed.   ED Course  Procedures (including critical care time) Labs Review Labs Reviewed  CBC WITH DIFFERENTIAL - Abnormal; Notable for the following:    RBC 2.82 (*)    Hemoglobin 8.7 (*)    HCT 26.6 (*)    All other components within normal limits  COMPREHENSIVE METABOLIC PANEL - Abnormal; Notable for the following:    Potassium 3.3 (*)    Glucose, Bld 118 (*)    BUN 29 (*)    Creatinine, Ser 1.76 (*)    Albumin 3.3 (*)    Total Bilirubin <0.2 (*)    GFR calc non Af Amer 26 (*)    GFR calc Af  Amer 30 (*)    Anion gap 16 (*)    All other components within normal limits  APTT  PROTIME-INR  TYPE AND SCREEN      MDM   Final diagnoses:  Rectal bleeding    Pt is having recurrent rectal bleeding.  Recently in the hospital for a diverticular bleed.  With her recurrent episodes, and age, will consult with medical service regarding admission for observation, serial HGB testing.    Linwood DibblesJon Dominica Kent, MD 09/29/14 [phone]

## 2014-09-29 ENCOUNTER — Encounter (HOSPITAL_COMMUNITY): Payer: Self-pay | Admitting: Gastroenterology

## 2014-09-29 DIAGNOSIS — N179 Acute kidney failure, unspecified: Secondary | ICD-10-CM

## 2014-09-29 DIAGNOSIS — N189 Chronic kidney disease, unspecified: Secondary | ICD-10-CM

## 2014-09-29 DIAGNOSIS — E785 Hyperlipidemia, unspecified: Secondary | ICD-10-CM

## 2014-09-29 DIAGNOSIS — K625 Hemorrhage of anus and rectum: Secondary | ICD-10-CM

## 2014-09-29 LAB — HEMOGLOBIN AND HEMATOCRIT, BLOOD
HEMATOCRIT: 26.2 % — AB (ref 36.0–46.0)
HEMOGLOBIN: 8.8 g/dL — AB (ref 12.0–15.0)

## 2014-09-29 LAB — COMPREHENSIVE METABOLIC PANEL
ALBUMIN: 3.3 g/dL — AB (ref 3.5–5.2)
ALT: 14 U/L (ref 0–35)
ALT: 11 U/L (ref 0–35)
ANION GAP: 16 — AB (ref 5–15)
AST: 17 U/L (ref 0–37)
AST: 14 U/L (ref 0–37)
Albumin: 2.7 g/dL — ABNORMAL LOW (ref 3.5–5.2)
Alkaline Phosphatase: 100 U/L (ref 39–117)
Alkaline Phosphatase: 82 U/L (ref 39–117)
Anion gap: 14 (ref 5–15)
BUN: 29 mg/dL — AB (ref 6–23)
BUN: 29 mg/dL — AB (ref 6–23)
CALCIUM: 9.5 mg/dL (ref 8.4–10.5)
CALCIUM: 8.9 mg/dL (ref 8.4–10.5)
CO2: 24 mEq/L (ref 19–32)
CO2: 26 meq/L (ref 19–32)
CREATININE: 1.54 mg/dL — AB (ref 0.50–1.10)
Chloride: 103 mEq/L (ref 96–112)
Chloride: 106 mEq/L (ref 96–112)
Creatinine, Ser: 1.76 mg/dL — ABNORMAL HIGH (ref 0.50–1.10)
GFR calc Af Amer: 30 mL/min — ABNORMAL LOW (ref >90)
GFR calc non Af Amer: 26 mL/min — ABNORMAL LOW (ref >90)
GFR, EST AFRICAN AMERICAN: 36 mL/min — AB (ref >90)
GFR, EST NON AFRICAN AMERICAN: 31 mL/min — AB (ref >90)
GLUCOSE: 108 mg/dL — AB (ref 70–99)
Glucose, Bld: 118 mg/dL — ABNORMAL HIGH (ref 70–99)
POTASSIUM: 3.3 meq/L — AB (ref 3.7–5.3)
Potassium: 3.4 mEq/L — ABNORMAL LOW (ref 3.7–5.3)
Sodium: 143 mEq/L (ref 137–147)
Sodium: 146 mEq/L (ref 137–147)
TOTAL PROTEIN: 7 g/dL (ref 6.0–8.3)
Total Bilirubin: <0.2 mg/dL — ABNORMAL LOW (ref 0.3–1.2)
Total Protein: 5.7 g/dL — ABNORMAL LOW (ref 6.0–8.3)

## 2014-09-29 LAB — CBC
HCT: 22.2 % — ABNORMAL LOW (ref 36.0–46.0)
Hemoglobin: 7.2 g/dL — ABNORMAL LOW (ref 12.0–15.0)
MCH: 30.8 pg (ref 26.0–34.0)
MCHC: 32.4 g/dL (ref 30.0–36.0)
MCV: 94.9 fL (ref 78.0–100.0)
PLATELETS: 228 10*3/uL (ref 150–400)
RBC: 2.34 MIL/uL — ABNORMAL LOW (ref 3.87–5.11)
RDW: 14.5 % (ref 11.5–15.5)
WBC: 5.6 10*3/uL (ref 4.0–10.5)

## 2014-09-29 LAB — PREPARE RBC (CROSSMATCH)

## 2014-09-29 LAB — PROTIME-INR
INR: 1.13 (ref 0.00–1.49)
PROTHROMBIN TIME: 14.6 s (ref 11.6–15.2)

## 2014-09-29 MED ORDER — SODIUM CHLORIDE 0.9 % IV SOLN
Freq: Once | INTRAVENOUS | Status: AC
  Administered 2014-09-29: 18:00:00 via INTRAVENOUS

## 2014-09-29 MED ORDER — ONDANSETRON HCL 4 MG PO TABS
4.0000 mg | ORAL_TABLET | Freq: Four times a day (QID) | ORAL | Status: DC | PRN
Start: 2014-09-29 — End: 2014-10-03

## 2014-09-29 MED ORDER — ACETAMINOPHEN 650 MG RE SUPP
650.0000 mg | Freq: Four times a day (QID) | RECTAL | Status: DC | PRN
Start: 2014-09-29 — End: 2014-10-03

## 2014-09-29 MED ORDER — ONDANSETRON HCL 4 MG/2ML IJ SOLN: 4.0000 mg | Freq: Four times a day (QID) | INTRAMUSCULAR | Status: DC | PRN

## 2014-09-29 MED ORDER — SODIUM CHLORIDE 0.9 % IV BOLUS (SEPSIS)
250.0000 mL | Freq: Once | INTRAVENOUS | Status: AC
  Administered 2014-09-29: 250 mL via INTRAVENOUS

## 2014-09-29 MED ORDER — SODIUM CHLORIDE 0.9 % IV BOLUS (SEPSIS)
1000.0000 mL | Freq: Once | INTRAVENOUS | Status: AC
  Administered 2014-09-29: 1000 mL via INTRAVENOUS

## 2014-09-29 MED ORDER — SODIUM CHLORIDE 0.9 % IV SOLN: Freq: Once | INTRAVENOUS | Status: AC

## 2014-09-29 MED ORDER — SODIUM CHLORIDE 0.9 % IV SOLN
Freq: Once | INTRAVENOUS | Status: AC
  Administered 2014-09-29: 10:00:00 via INTRAVENOUS

## 2014-09-29 MED ORDER — ACETAMINOPHEN 325 MG PO TABS: 650.0000 mg | ORAL_TABLET | Freq: Four times a day (QID) | ORAL | Status: DC | PRN

## 2014-09-29 MED ORDER — ROSUVASTATIN CALCIUM 10 MG PO TABS
10.0000 mg | ORAL_TABLET | Freq: Every day | ORAL | Status: DC
  Administered 2014-09-29 – 2014-10-02 (×4): 10 mg via ORAL
  Filled 2014-09-29 (×5): qty 1

## 2014-09-29 MED ORDER — TRAMADOL-ACETAMINOPHEN 37.5-325 MG PO TABS: 1.0000 | ORAL_TABLET | Freq: Three times a day (TID) | ORAL | Status: DC | PRN

## 2014-09-29 MED ORDER — SODIUM CHLORIDE 0.9 % IV SOLN
1000.0000 mL | INTRAVENOUS | Status: DC
  Administered 2014-09-29: 1000 mL via INTRAVENOUS

## 2014-09-29 MED ORDER — PANTOPRAZOLE SODIUM 40 MG PO TBEC
40.0000 mg | DELAYED_RELEASE_TABLET | Freq: Two times a day (BID) | ORAL | Status: DC
  Administered 2014-09-29 – 2014-10-03 (×9): 40 mg via ORAL
  Filled 2014-09-29 (×12): qty 1

## 2014-09-29 NOTE — Progress Notes (Signed)
Patient seen and examined No active bleeding going on but the patient's hemoglobin has dropped from 11.0 on 11/24-7.2  Subjective No rectal bleeding since admission  Assessment and plan 1. Rectal bleeding No rectal bleeding since admission Receiving transfusion of 2 units of packed red blood cells Requested Dr. Elnoria HowardHung to see the patient Had colonoscopy during her last admission  At present due to her mild elevation in serum creatinine I would continue treating her with IV fluids and hold her Lasix. Continue Protonix.  2. Acute on chronic renal failure. Etiology currently unclear most likely prerenal.Patient instructed to hold lisinopril prior to discharge during her last discharge  We will continue hydration and recheck BMP in the morning. Holding Lasix at present.  3. Generalized arthritis. Ultracet as needed.

## 2014-09-29 NOTE — Progress Notes (Signed)
735 pm-At change of shift rounds pt requested assistance to restroom, pt found to have passed large amount of bright red blood and black blood clots in underwear and toilet (estimated 350-400cc). Pt complaint of weakness, light headedness/dizzyness. Vitals taken BP 91/57, pulse 89, oxygen sat 97% on room air. Rapid nurse assistance requested, vital re-check once patient back to bed 104/59, pulse 72, oxygen 98% on room air. On-call notified. Fluid bolus and labs ordered. Vital stable since, pt with no complaints. Will continue to monitor.

## 2014-09-29 NOTE — Progress Notes (Signed)
Subjective: No complaints.  Objective: Vital signs in last 24 hours: Temp:  [97.5 F (36.4 C)-98.3 F (36.8 C)] 98.3 F (36.8 C) (12/01 1215) Pulse Rate:  [70-99] 70 (12/01 1215) Resp:  [18-20] 20 (12/01 1215) BP: (90-142)/(40-68) 118/60 mmHg (12/01 1215) SpO2:  [94 %-100 %] 95 % (12/01 1215) Weight:  [89.359 kg (197 lb)-89.8 kg (197 lb 15.6 oz)] 89.359 kg (197 lb) (12/01 0521) Last BM Date: 09/29/14  Intake/Output from previous day:   Intake/Output this shift: Total I/O In: 365 [Blood:365] Out: -   General appearance: alert and no distress GI: soft, non-tender; bowel sounds normal; no masses,  no organomegaly  Lab Results:  Recent Labs  09/28/14 2253 09/29/14 0431  WBC 5.6 5.6  HGB 8.7* 7.2*  HCT 26.6* 22.2*  PLT 286 228   BMET  Recent Labs  09/28/14 2253 09/29/14 0431  NA 143 146  K 3.3* 3.4*  CL 103 106  CO2 24 26  GLUCOSE 118* 108*  BUN 29* 29*  CREATININE 1.76* 1.54*  CALCIUM 9.5 8.9   LFT  Recent Labs  09/29/14 0431  PROT 5.7*  ALBUMIN 2.7*  AST 14  ALT 11  ALKPHOS 82  BILITOT <0.2*   PT/INR  Recent Labs  09/28/14 2253 09/29/14 0431  LABPROT 13.7 14.6  INR 1.03 1.13   Hepatitis Panel No results for input(s): HEPBSAG, HCVAB, HEPAIGM, HEPBIGM in the last 72 hours. C-Diff No results for input(s): CDIFFTOX in the last 72 hours. Fecal Lactopherrin No results for input(s): FECLLACTOFRN in the last 72 hours.  Studies/Results: No results found.  Medications:  Scheduled: . sodium chloride   Intravenous Once  . pantoprazole  40 mg Oral BID AC  . rosuvastatin  10 mg Oral QHS   Continuous:   Assessment/Plan: 1) Diverticular bleed. 2) Hematochezia. 3) Anemia.    She represents with recurrent painless hematochezia.  It has improved since being rehospitalizaed.  The colonoscopy last week revealed blood in the left side of the colon.  She had diverticula in the proximal colon, but there was no blood in that area.  I am not  surprised that she has a recurrence of her bleeding.  Rebleeding is not uncommon with a diverticular bleed.  She is stable at this time.  If she continues to bleed and requires 6 or more units of blood then surgical/IR intervention will be required.  A bleeding scan can be performed at this time, but I doubt that it will isolate her bleeding.    Plan: 1) Monitor HGB. 2) Transfuse as necessary.  LOS: 1 day   Marny Smethers D 09/29/2014, 12:46 PM

## 2014-09-29 NOTE — Plan of Care (Signed)
Problem: Phase I Progression Outcomes Goal: Pain controlled with appropriate interventions Outcome: Completed/Met Date Met:  09/29/14 Goal: OOB as tolerated unless otherwise ordered Outcome: Completed/Met Date Met:  09/29/14 Goal: Voiding-avoid urinary catheter unless indicated Outcome: Completed/Met Date Met:  09/29/14     

## 2014-09-29 NOTE — Plan of Care (Signed)
Problem: Phase I Progression Outcomes Goal: Hemodynamically stable Outcome: Completed/Met Date Met:  09/29/14

## 2014-09-29 NOTE — H&P (Signed)
[facility] History and Physical  Patient: Loretta Hartman  [id number]  DOB: 10/15/1932  DOS: the patient was seen and examined on 09/29/2014 PCP: [name],[name], MD  Chief Complaint: Rectal bleeding  HPI: Loretta Hartman is a 80 y.o. female with Past medical history of diverticular bleeding, hypertension, dyslipidemia, arthritis. The patient is presenting with complaints of 2 episodes of bright red blood per rectum without any pain. She mentions that she was recently hospitalized for the same situation and since she she did not have any active bleeding she was discharged home. After going home she started taking aspirin on a regular basis. Also due to her severe arthritis that she she took 2 Aleve yesterday. After that in the afternoon she started having complaints of stomach uneasiness and later on had 2 episodes of bright red blood per rectum. She also started having dizziness and the tiredness and therefore decided to come to the hospital. Denies any fever or chills chest pain shortness of breath nausea vomiting burning urination active bleeding anywhere else.  The patient is coming from home. And at her baseline independent for most of her ADL.  Review of Systems: as mentioned in the history of present illness.  A Comprehensive review of the other systems is negative.  Past Medical History  Diagnosis Date  . Bronchitis   . Hypertension   . Hyperlipidemia    Past Surgical History  Procedure Laterality Date  . Total knee arthroplasty      bilateral  . Cholecystectomy     Social History:  reports that she quit smoking about 24 years ago. Her smokeless tobacco use includes Snuff. She reports that she does not drink alcohol or use illicit drugs.  Allergies  Allergen Reactions  . Penicillins Anaphylaxis  . Clindamycin/Lincomycin Rash    Family History  Problem Relation Age of Onset  . Cancer Father   . Cancer Brother   . Diabetes Brother     Prior to  Admission medications   Medication Sig Start Date End Date Taking? Authorizing Provider  aspirin EC 81 MG tablet Take 81 mg by mouth daily.   Yes Historical Provider, MD  furosemide (LASIX) 40 MG tablet Take 40 mg by mouth daily.  09/02/14  Yes Historical Provider, MD  lisinopril (PRINIVIL,ZESTRIL) 10 MG tablet Take 10 mg by mouth daily. 07/16/14  Yes Historical Provider, MD  Multiple Vitamin (MULTIVITAMIN WITH MINERALS) TABS tablet Take 1 tablet by mouth daily.   Yes Historical Provider, MD  Omega-3 Fatty Acids (OMEGA 3 PO) Take 1 capsule by mouth daily.   Yes Historical Provider, MD  pantoprazole (PROTONIX) 40 MG tablet Take 1 tablet (40 mg total) by mouth daily. 09/24/14  Yes Richarda OverlieNayana Abrol, MD  rosuvastatin (CRESTOR) 10 MG tablet Take 10 mg by mouth at bedtime.    Yes Historical Provider, MD  fluorometholone (FML) 0.1 % ophthalmic suspension Place 1 drop into both eyes as needed.  07/13/14   Historical Provider, MD    Physical Exam: Filed Vitals:   09/28/14 2230 09/29/14 0054 09/29/14 0126 09/29/14 0521  BP: 121/60 98/49 131/62 90/40  Pulse: 95 77 71 73  Temp:   97.8 F (36.6 C) 97.6 F (36.4 C)  TempSrc:   Oral Oral  Resp:  19 18 18   Height:   5\' 2"  (1.575 m)   Weight:   89.8 kg (197 lb 15.6 oz) 89.359 kg (197 lb)  SpO2: 100% 95% 100% 94%    General: Alert, Awake and Oriented to  Time, Place and Person. Appear in mild distress Eyes: PERRL ENT: Oral Mucosa clear moist. Neck: no JVD Cardiovascular: S1 and S2 Present, no Murmur, Peripheral Pulses Present Respiratory: Bilateral Air entry equal and Decreased, Clear to Auscultation, oCrackles, o wheezes Abdomen: Bowel Sound present, Soft and non tender Skin: no Rash Extremities: no Pedal edema, no calf tenderness Neurologic: Grossly no focal neuro deficit.  Labs on Admission:  CBC:  Recent Labs Lab 09/22/14 0606 09/22/14 1402 09/22/14 2210 09/23/14 0519 09/24/14 0515 09/28/14 2253 09/29/14 0431  WBC 5.5 4.8 5.2 4.3 4.8 5.6  5.6  NEUTROABS 3.8 2.7 2.4 2.2  --  3.7  --   HGB 11.0* 10.7* 11.0* 9.2* 8.8* 8.7* 7.2*  HCT 33.8* 33.4* 35.1* 28.7* 27.3* 26.6* 22.2*  MCV 94.9 96.3 97.0 95.3 96.1 94.3 94.9  PLT 243 232 239 218 201 286 228    CMP     Component Value Date/Time   NA 146 09/29/2014 0431   K 3.4* 09/29/2014 0431   CL 106 09/29/2014 0431   CO2 26 09/29/2014 0431   GLUCOSE 108* 09/29/2014 0431   BUN 29* 09/29/2014 0431   CREATININE 1.54* 09/29/2014 0431   CALCIUM 8.9 09/29/2014 0431   PROT 5.7* 09/29/2014 0431   ALBUMIN 2.7* 09/29/2014 0431   AST 14 09/29/2014 0431   ALT 11 09/29/2014 0431   ALKPHOS 82 09/29/2014 0431   BILITOT <0.2* 09/29/2014 0431   GFRNONAA 31* 09/29/2014 0431   GFRAA 36* 09/29/2014 0431    No results for input(s): LIPASE, AMYLASE in the last 168 hours. No results for input(s): AMMONIA in the last 168 hours.  No results for input(s): CKTOTAL, CKMB, CKMBINDEX, TROPONINI in the last 168 hours. BNP (last 3 results)  Recent Labs  06/08/14 1537  PROBNP 453.9*    Radiological Exams on Admission: No results found.   Assessment/Plan Principal Problem:   Rectal bleeding Active Problems:   Hyperlipidemia   Acute on chronic renal failure   1. Rectal bleeding The patient is presenting with complaints of 2 episodes of bright red blood per rectum. Her hemoglobin is stable and she is hemodynamically stable but has occasionally low blood pressure is 90s. Patient is currently receiving IV hydration. At present due to her mild elevation in serum creatinine I would continue treating her with IV fluids and hold her Lasix. Monitor her H&H closely and transfuse as needed and if her hemoglobin is less than 7. If her hemoglobin drops down she will require a consultation with GI as well. Recommended patient to stop taking any NSAIDs. Currently holding aspirin. Changing her Protonix from daily to twice a day.  2. Acute on chronic renal failure. Etiology currently unclear most  likely prerenal. We will continue hydration and recheck BMP in the morning. Holding Lasix at present.  3. Generalized arthritis. Ultracet as needed.  Advance goals of care discussion: Full code   DVT Prophylaxis: mechanical compression device Nutrition: Clear liquid diet, nothing by mouth in morning  Disposition: Admitted to observation in med-surge unit.  Author: Lynden OxfordPranav Otis Portal, MD [facility] Pager: [phone] 09/29/2014, 6:01 AM    If 7PM-7AM, please contact night-coverage [email or web address] Password [email or web address]

## 2014-09-29 NOTE — Progress Notes (Signed)
UR completed 

## 2014-09-30 DIAGNOSIS — D62 Acute posthemorrhagic anemia: Secondary | ICD-10-CM

## 2014-09-30 DIAGNOSIS — N179 Acute kidney failure, unspecified: Secondary | ICD-10-CM | POA: Diagnosis present

## 2014-09-30 DIAGNOSIS — Z79899 Other long term (current) drug therapy: Secondary | ICD-10-CM | POA: Diagnosis not present

## 2014-09-30 DIAGNOSIS — E785 Hyperlipidemia, unspecified: Secondary | ICD-10-CM | POA: Diagnosis present

## 2014-09-30 DIAGNOSIS — Z87891 Personal history of nicotine dependence: Secondary | ICD-10-CM | POA: Diagnosis not present

## 2014-09-30 DIAGNOSIS — K625 Hemorrhage of anus and rectum: Secondary | ICD-10-CM | POA: Diagnosis present

## 2014-09-30 DIAGNOSIS — Z96653 Presence of artificial knee joint, bilateral: Secondary | ICD-10-CM | POA: Diagnosis present

## 2014-09-30 DIAGNOSIS — K5791 Diverticulosis of intestine, part unspecified, without perforation or abscess with bleeding: Secondary | ICD-10-CM | POA: Diagnosis present

## 2014-09-30 DIAGNOSIS — I129 Hypertensive chronic kidney disease with stage 1 through stage 4 chronic kidney disease, or unspecified chronic kidney disease: Secondary | ICD-10-CM | POA: Diagnosis present

## 2014-09-30 DIAGNOSIS — K922 Gastrointestinal hemorrhage, unspecified: Secondary | ICD-10-CM

## 2014-09-30 DIAGNOSIS — Z7982 Long term (current) use of aspirin: Secondary | ICD-10-CM | POA: Diagnosis not present

## 2014-09-30 DIAGNOSIS — N183 Chronic kidney disease, stage 3 (moderate): Secondary | ICD-10-CM | POA: Diagnosis present

## 2014-09-30 DIAGNOSIS — M130 Polyarthritis, unspecified: Secondary | ICD-10-CM | POA: Diagnosis present

## 2014-09-30 LAB — BASIC METABOLIC PANEL
ANION GAP: 9 (ref 5–15)
BUN: 27 mg/dL — ABNORMAL HIGH (ref 6–23)
CALCIUM: 8.2 mg/dL — AB (ref 8.4–10.5)
CHLORIDE: 110 meq/L (ref 96–112)
CO2: 24 meq/L (ref 19–32)
Creatinine, Ser: 1.3 mg/dL — ABNORMAL HIGH (ref 0.50–1.10)
GFR calc Af Amer: 44 mL/min — ABNORMAL LOW (ref >90)
GFR calc non Af Amer: 38 mL/min — ABNORMAL LOW (ref >90)
GLUCOSE: 102 mg/dL — AB (ref 70–99)
Potassium: 4.4 mEq/L (ref 3.7–5.3)
SODIUM: 143 meq/L (ref 137–147)

## 2014-09-30 LAB — CBC
HCT: 22.8 % — ABNORMAL LOW (ref 36.0–46.0)
Hemoglobin: 7.7 g/dL — ABNORMAL LOW (ref 12.0–15.0)
MCH: 30.8 pg (ref 26.0–34.0)
MCHC: 33.8 g/dL (ref 30.0–36.0)
MCV: 91.2 fL (ref 78.0–100.0)
PLATELETS: 158 10*3/uL (ref 150–400)
RBC: 2.5 MIL/uL — AB (ref 3.87–5.11)
RDW: 16.4 % — ABNORMAL HIGH (ref 11.5–15.5)
WBC: 7.5 10*3/uL (ref 4.0–10.5)

## 2014-09-30 LAB — HEMOGLOBIN AND HEMATOCRIT, BLOOD
HCT: 22.9 % — ABNORMAL LOW (ref 36.0–46.0)
HCT: 22.4 % — ABNORMAL LOW (ref 36.0–46.0)
HEMATOCRIT: 24.1 % — AB (ref 36.0–46.0)
HEMOGLOBIN: 7.5 g/dL — AB (ref 12.0–15.0)
HEMOGLOBIN: 7.8 g/dL — AB (ref 12.0–15.0)
Hemoglobin: 7.9 g/dL — ABNORMAL LOW (ref 12.0–15.0)

## 2014-09-30 LAB — PREPARE RBC (CROSSMATCH)

## 2014-09-30 MED ORDER — SODIUM CHLORIDE 0.9 % IV SOLN
Freq: Once | INTRAVENOUS | Status: AC
  Administered 2014-09-30: 13:00:00 via INTRAVENOUS

## 2014-09-30 MED ORDER — SODIUM CHLORIDE 0.9 % IV SOLN: Freq: Once | INTRAVENOUS | Status: DC

## 2014-09-30 NOTE — Progress Notes (Signed)
UR completed 

## 2014-09-30 NOTE — Progress Notes (Signed)
Progress Note   Loretta Hartman [id number] DOB: 01/01/1932 DOA: 09/28/2014 PCP: [name],[name], MD   Brief Narrative:   Loretta Hartman is an 80 y.o. female with a PMH of recent diverticular bleeding, hypertension, dyslipidemia and arthritis who was admitted 09/28/14 with bright red blood per rectum in the setting of aspirin/NSAID use.  Assessment/Plan:   Principal Problem:   Rectal bleeding / diverticular bleeding / acute blood loss anemia  Likely precipitated by aspirin/NSAID use.  Avoid aspirin/NSAIDs and monitor.  Continue every 6 hour H and H checks, and transfuse as needed.  Status post 2 units of PRBCs overnight. We'll give an additional unit today.  Active Problems:   Hyperlipidemia  Continue Crestor.    Acute on chronic renal failure  Baseline creatinine 1.08. Current creatinine elevated over usual baseline values, likely related to hypotension from blood loss. Creatinine improving with IV fluids.    DVT Prophylaxis  SCDs ordered.  Code Status: Full. Family Communication: No family at the bedside. Disposition Plan: Home when stable.   IV Access:    Peripheral IV   Procedures and diagnostic studies:   No results found.   Medical Consultants:    GI  Anti-Infectives:    None.  Subjective:   [name] continues to have bloody stools, with passing of clots.  Reports a little bit of abdominal discomfort.  Got dizzy during last BM.    Objective:    Filed Vitals:   09/29/14 2130 09/30/14 0041 09/30/14 0225 09/30/14 0457  BP: 110/48 103/52 103/43 105/55  Pulse: 63 68 84 64  Temp:   98.3 F (36.8 C) 97.8 F (36.6 C)  TempSrc:   Oral Oral  Resp:   20 18  Height:      Weight:    91.8 kg (202 lb 6.1 oz)  SpO2: 99% 98% 100% 100%    Intake/Output Summary (Last 24 hours) at 09/30/14 1549 Last data filed at 09/30/14 0657  Gross per 24 hour  Intake      0 ml  Output      3 ml  Net     -3 ml    Exam: Gen:   NAD Cardiovascular:  RRR, No M/R/G Respiratory:  Lungs CTAB Gastrointestinal:  Abdomen soft, NT/ND, + BS Extremities:  No C/E/C   Data Reviewed:    Labs: Basic Metabolic Panel:  Recent Labs Lab 09/24/14 0515 09/28/14 2253 09/29/14 0431 09/30/14 0535  NA 140 143 146 143  K 4.3 3.3* 3.4* 4.4  CL 106 103 106 110  CO2 22 24 26 24   GLUCOSE 106* 118* 108* 102*  BUN 19 29* 29* 27*  CREATININE 1.08 1.76* 1.54* 1.30*  CALCIUM 8.9 9.5 8.9 8.2*   GFR Estimated Creatinine Clearance: 36.4 mL/min (by C-G formula based on Cr of 1.3). Liver Function Tests:  Recent Labs Lab 09/24/14 0515 09/28/14 2253 09/29/14 0431  AST 17 17 14   ALT 11 14 11   ALKPHOS 90 100 82  BILITOT 0.3 <0.2* <0.2*  PROT 6.2 7.0 5.7*  ALBUMIN 2.8* 3.3* 2.7*   Coagulation profile  Recent Labs Lab 09/28/14 2253 09/29/14 0431  INR 1.03 1.13    CBC:  Recent Labs Lab 09/24/14 0515 09/28/14 2253 09/29/14 0431 09/29/14 2000 09/30/14 0035 09/30/14 0535 09/30/14 0937  WBC 4.8 5.6 5.6  --   --  7.5  --   NEUTROABS  --  3.7  --   --   --   --   --  HGB 8.8* 8.7* 7.2* 8.8* 7.8* 7.7* 7.5*  HCT 27.3* 26.6* 22.2* 26.2* 22.9* 22.8* 22.4*  MCV 96.1 94.3 94.9  --   --  91.2  --   PLT 201 286 228  --   --  158  --    BNP (last 3 results)  Recent Labs  06/08/14 1537  PROBNP 453.9*   Microbiology No results found for this or any previous visit (from the past 240 hour(s)).   Medications:   . pantoprazole  40 mg Oral BID AC  . rosuvastatin  10 mg Oral QHS   Continuous Infusions:   Time spent: 25 minutes.   LOS: 2 days   RAMA,CHRISTINA  [facility] Pager [phone]. If unable to reach me by pager, please call my cell phone at [phone].  *Please refer to [email or web address], password [email or web address] to get updated schedule on who will round on this patient, as hospitalists switch teams weekly. If 7PM-7AM, please contact night-coverage at [email or web address], password [email or web address] for any overnight needs.  09/30/2014,  3:49 PM

## 2014-09-30 NOTE — Progress Notes (Signed)
RN paged NP at beginning of shift because pt had rectal bleeding. Spoke with rapid response RN on the scene. States pt had BRBPR in toilet and also on pad with some clotting. RRRN estimated ? 300cc or so. Pt is s/p 2 U transfused on days. Post transfusion H/H improved. BP was soft but pt had been up to toilet and BP returned to normal.  Pt has had no further rectal bleeding during shift. Am H/H down to 7.7. GI following. Place on serial H/Hs. Report to oncoming attending. VS remain stable. Eula ListenK. Kirby-Graham, NP [facility]

## 2014-09-30 NOTE — Progress Notes (Addendum)
Unassigned patient Subjective: Events since readmission noted. Patient is sitting up in a chair and denies having any abdominal pain, nausea or vomiting. She has had some blood in her stool this morning and none with the second BM this afternoon [as per the nursing staff]. She claims she was dizzy this morning after a BM. Feels better this afternoon.    Objective: Vital signs in last 24 hours: Temp:  [97.8 F (36.6 C)-98.4 F (36.9 C)] 97.8 F (36.6 C) (12/02 0457) Pulse Rate:  [63-89] 64 (12/02 0457) Resp:  [18-22] 18 (12/02 0457) BP: (91-110)/(43-59) 105/55 mmHg (12/02 0457) SpO2:  [97 %-100 %] 100 % (12/02 0457) Weight:  [91.8 kg (202 lb 6.1 oz)] 91.8 kg (202 lb 6.1 oz) (12/02 0457) Last BM Date: 09/30/14  Intake/Output from previous day: 12/01 0701 - 12/02 0700 In: 1495 [I.V.:1100; Blood:395] Out: 3 [Urine:2; Stool:1] Intake/Output this shift:   General appearance: alert, no distress and mildly obese Resp: clear to auscultation bilaterally Cardio: regular rate and rhythm, S1, S2 normal, no murmur, click, rub or gallop GI: soft, non-tender; bowel sounds normal; no masses,  no organomegaly  Lab Results:  Recent Labs  09/28/14 2253 09/29/14 0431  09/30/14 0535 09/30/14 0937 09/30/14 1545  WBC 5.6 5.6  --  7.5  --   --   HGB 8.7* 7.2*  < > 7.7* 7.5* 7.9*  HCT 26.6* 22.2*  < > 22.8* 22.4* 24.1*  PLT 286 228  --  158  --   --   < > = values in this interval not displayed. BMET  Recent Labs  09/28/14 2253 09/29/14 0431 09/30/14 0535  NA 143 146 143  K 3.3* 3.4* 4.4  CL 103 106 110  CO2 24 26 24   GLUCOSE 118* 108* 102*  BUN 29* 29* 27*  CREATININE 1.76* 1.54* 1.30*  CALCIUM 9.5 8.9 8.2*   LFT  Recent Labs  09/29/14 0431  PROT 5.7*  ALBUMIN 2.7*  AST 14  ALT 11  ALKPHOS 82  BILITOT <0.2*   PT/INR  Recent Labs  09/28/14 2253 09/29/14 0431  LABPROT 13.7 14.6  INR 1.03 1.13   Medications: I have reviewed the patient's current  medications.  Assessment/Plan: Rectal bleeding/anemia?diverticular bleed: being transfused as per [facility]P recommendations. Continue present care.   LOS: 2 days   Taffany Heiser 09/30/2014, 4:19 PM

## 2014-09-30 NOTE — Care Management Note (Signed)
CARE MANAGEMENT NOTE 09/30/2014  Patient:  Loretta Hartman,Loretta Hartman   Account Number:  [id number]  Date Initiated:  09/30/2014  Documentation initiated by:  Sandford CrazeLEMENTS,Masiah Lewing  Subjective/Objective Assessment:   80 yo admitted with Rectal Bleed.  PMH of recent diverticular bleeding, hypertension, dyslipidemia and arthritis     Action/Plan:   From home with Children   Anticipated DC Date:  10/05/2014   Anticipated DC Plan:  HOME/SELF CARE      DC Planning Services  CM consult      Choice offered to / List presented to:             Status of service:  In process, will continue to follow Medicare Important Message given?   (If response is "NO", the following Medicare IM given date fields will be blank) Date Medicare IM given:   Medicare IM given by:   Date Additional Medicare IM given:   Additional Medicare IM given by:    Discharge Disposition:    Per UR Regulation:  Reviewed for med. necessity/level of care/duration of stay  If discussed at Long Length of Stay Meetings, dates discussed:    Comments:  09/30/14 Sandford Crazeora Cullen Lahaie RN,BSN,NCM [phone] Chart reviewed and CM following for DC needs.

## 2014-10-01 DIAGNOSIS — N183 Chronic kidney disease, stage 3 (moderate): Secondary | ICD-10-CM

## 2014-10-01 DIAGNOSIS — I10 Essential (primary) hypertension: Secondary | ICD-10-CM

## 2014-10-01 LAB — HEMOGLOBIN AND HEMATOCRIT, BLOOD
HEMATOCRIT: 22.6 % — AB (ref 36.0–46.0)
HEMATOCRIT: 24.1 % — AB (ref 36.0–46.0)
HEMATOCRIT: 25.5 % — AB (ref 36.0–46.0)
HEMOGLOBIN: 7.6 g/dL — AB (ref 12.0–15.0)
HEMOGLOBIN: 8.5 g/dL — AB (ref 12.0–15.0)
Hemoglobin: 8.1 g/dL — ABNORMAL LOW (ref 12.0–15.0)

## 2014-10-01 NOTE — Plan of Care (Signed)
Problem: Phase III Progression Outcomes Goal: Voiding independently Outcome: Progressing     

## 2014-10-01 NOTE — Plan of Care (Signed)
Problem: Phase II Progression Outcomes Goal: Progress activity as tolerated unless otherwise ordered Outcome: Progressing     

## 2014-10-01 NOTE — Plan of Care (Signed)
Problem: Phase II Progression Outcomes Goal: Vital signs remain stable Outcome: Progressing     

## 2014-10-01 NOTE — Progress Notes (Signed)
Progress Note   Loretta Hartman [id number] DOB: 02/03/1932 DOA: 09/28/2014 PCP: [name],[name], MD   Brief Narrative:   Loretta Hartman is an 80 y.o. female with a PMH of recent diverticular bleeding, hypertension, dyslipidemia and arthritis who was admitted 09/28/14 with bright red blood per rectum in the setting of aspirin/NSAID use.  Assessment/Plan:   Principal Problem:   Rectal bleeding / diverticular bleeding / acute blood loss anemia  Likely precipitated by aspirin/NSAID use.  Avoid aspirin/NSAIDs and monitor.  Continue every 6 hour H and H checks, and transfuse as needed.  Status post 3 units of PRBCs.  Hemoglobin stable.  GI following.  Active Problems:   Hyperlipidemia  Continue Crestor.    Acute on chronic renal failure  Baseline creatinine 1.08. Current creatinine elevated over usual baseline values, likely related to hypotension from blood loss. Creatinine improving with IV fluids.    DVT Prophylaxis  SCDs ordered.  Code Status: Full. Family Communication: No family at the bedside. Disposition Plan: Home when stable.   IV Access:    Peripheral IV   Procedures and diagnostic studies:   No results found.   Medical Consultants:    Dr. Charna ElizabethJyothi Mann, GI  Anti-Infectives:    None.  Subjective:   Loretta Hartman has not had any significant bowel movements yet, but no further BRBPR.  No N/V.  Appetite OK.  No complaints of pain.    Objective:    Filed Vitals:   09/30/14 2031 09/30/14 2214 10/01/14 0453 10/01/14 1314  BP: 139/60 139/59 122/50 134/55  Pulse: 78 66 60 67  Temp: 98.2 F (36.8 C) 98.1 F (36.7 C) 97.9 F (36.6 C) 97.6 F (36.4 C)  TempSrc: Oral Oral Oral Oral  Resp: 18 20 18 16   Height:      Weight:   93 kg (205 lb 0.4 oz)   SpO2: 100% 100% 100% 96%    Intake/Output Summary (Last 24 hours) at 10/01/14 1548 Last data filed at 10/01/14 1314  Gross per 24 hour  Intake   1060 ml  Output      0 ml    Net   1060 ml    Exam: Gen:  NAD Cardiovascular:  RRR, No M/R/G Respiratory:  Lungs CTAB Gastrointestinal:  Abdomen soft, NT/ND, + BS Extremities:  No C/E/C   Data Reviewed:    Labs: Basic Metabolic Panel:  Recent Labs Lab 09/28/14 2253 09/29/14 0431 09/30/14 0535  NA 143 146 143  K 3.3* 3.4* 4.4  CL 103 106 110  CO2 24 26 24   GLUCOSE 118* 108* 102*  BUN 29* 29* 27*  CREATININE 1.76* 1.54* 1.30*  CALCIUM 9.5 8.9 8.2*   GFR Estimated Creatinine Clearance: 36.7 mL/min (by C-G formula based on Cr of 1.3). Liver Function Tests:  Recent Labs Lab 09/28/14 2253 09/29/14 0431  AST 17 14  ALT 14 11  ALKPHOS 100 82  BILITOT <0.2* <0.2*  PROT 7.0 5.7*  ALBUMIN 3.3* 2.7*   Coagulation profile  Recent Labs Lab 09/28/14 2253 09/29/14 0431  INR 1.03 1.13    CBC:  Recent Labs Lab 09/28/14 2253 09/29/14 0431  09/30/14 0535 09/30/14 0937 09/30/14 1545 10/01/14 0030 10/01/14 0545 10/01/14 1258  WBC 5.6 5.6  --  7.5  --   --   --   --   --   NEUTROABS 3.7  --   --   --   --   --   --   --   --  HGB 8.7* 7.2*  < > 7.7* 7.5* 7.9* 8.1* 7.6* 8.5*  HCT 26.6* 22.2*  < > 22.8* 22.4* 24.1* 24.1* 22.6* 25.5*  MCV 94.3 94.9  --  91.2  --   --   --   --   --   PLT 286 228  --  158  --   --   --   --   --   < > = values in this interval not displayed. BNP (last 3 results)  Recent Labs  06/08/14 1537  PROBNP 453.9*   Microbiology No results found for this or any previous visit (from the past 240 hour(s)).   Medications:   . sodium chloride   Intravenous Once  . pantoprazole  40 mg Oral BID AC  . rosuvastatin  10 mg Oral QHS   Continuous Infusions:   Time spent: 25 minutes.   LOS: 3 days   Acea Yagi  [facility] Pager [phone]. If unable to reach me by pager, please call my cell phone at [phone].  *Please refer to [email or web address], password [email or web address] to get updated schedule on who will round on this patient, as hospitalists switch teams  weekly. If 7PM-7AM, please contact night-coverage at [email or web address], password [email or web address] for any overnight needs.  10/01/2014, 3:48 PM

## 2014-10-01 NOTE — Progress Notes (Signed)
Subjective: No further bleeding since yesterday AM.  No complaints.  Objective: Vital signs in last 24 hours: Temp:  [97.6 F (36.4 C)-98.7 F (37.1 C)] 97.6 F (36.4 C) (12/03 1314) Pulse Rate:  [60-80] 67 (12/03 1314) Resp:  [16-20] 16 (12/03 1314) BP: (122-139)/(50-60) 134/55 mmHg (12/03 1314) SpO2:  [96 %-100 %] 96 % (12/03 1314) Weight:  [93 kg (205 lb 0.4 oz)] 93 kg (205 lb 0.4 oz) (12/03 0453) Last BM Date: 09/30/14  Intake/Output from previous day: 12/02 0701 - 12/03 0700 In: 820 [P.O.:240; I.V.:250; Blood:330] Out: -  Intake/Output this shift: Total I/O In: 480 [P.O.:480] Out: -   General appearance: alert and no distress GI: soft, non-tender; bowel sounds normal; no masses,  no organomegaly  Lab Results:  Recent Labs  09/28/14 2253 09/29/14 0431  09/30/14 0535  10/01/14 0030 10/01/14 0545 10/01/14 1258  WBC 5.6 5.6  --  7.5  --   --   --   --   HGB 8.7* 7.2*  < > 7.7*  < > 8.1* 7.6* 8.5*  HCT 26.6* 22.2*  < > 22.8*  < > 24.1* 22.6* 25.5*  PLT 286 228  --  158  --   --   --   --   < > = values in this interval not displayed. BMET  Recent Labs  09/28/14 2253 09/29/14 0431 09/30/14 0535  NA 143 146 143  K 3.3* 3.4* 4.4  CL 103 106 110  CO2 24 26 24   GLUCOSE 118* 108* 102*  BUN 29* 29* 27*  CREATININE 1.76* 1.54* 1.30*  CALCIUM 9.5 8.9 8.2*   LFT  Recent Labs  09/29/14 0431  PROT 5.7*  ALBUMIN 2.7*  AST 14  ALT 11  ALKPHOS 82  BILITOT <0.2*   PT/INR  Recent Labs  09/28/14 2253 09/29/14 0431  LABPROT 13.7 14.6  INR 1.03 1.13   Hepatitis Panel No results for input(s): HEPBSAG, HCVAB, HEPAIGM, HEPBIGM in the last 72 hours. C-Diff No results for input(s): CDIFFTOX in the last 72 hours. Fecal Lactopherrin No results for input(s): FECLLACTOFRN in the last 72 hours.  Studies/Results: No results found.  Medications:  Scheduled: . sodium chloride   Intravenous Once  . pantoprazole  40 mg Oral BID AC  . rosuvastatin  10 mg Oral  QHS   Continuous:   Assessment/Plan: 1) Diverticular bleed.   She is stable.  I think it is prudent to monitor her until tomorrow morning.  If she does not have any further bleeding she can be discharged.  Since she had a second bleed her risk for bleeding is 25-50% for a third bleeding events.  Plan: 1) Monitor HGB. 2) Signing off.   LOS: 3 days   Corbet Hanley D 10/01/2014, 3:23 PM

## 2014-10-02 LAB — HEMOGLOBIN AND HEMATOCRIT, BLOOD
HCT: 22.8 % — ABNORMAL LOW (ref 36.0–46.0)
HEMOGLOBIN: 7.6 g/dL — AB (ref 12.0–15.0)

## 2014-10-02 LAB — PREPARE RBC (CROSSMATCH)

## 2014-10-02 MED ORDER — POLYETHYLENE GLYCOL 3350 17 G PO PACK
17.0000 g | PACK | Freq: Every day | ORAL | Status: DC
  Administered 2014-10-02 – 2014-10-03 (×2): 17 g via ORAL
  Filled 2014-10-02 (×2): qty 1

## 2014-10-02 MED ORDER — SODIUM CHLORIDE 0.9 % IV SOLN
Freq: Once | INTRAVENOUS | Status: AC
  Administered 2014-10-02: 13:00:00 via INTRAVENOUS

## 2014-10-02 NOTE — Progress Notes (Signed)
Progress Note   Marguerite OleaClara J Schmierer [id number] DOB: 08/28/1932 DOA: 09/28/2014 PCP: [name],[name], MD   Brief Narrative:   Marguerite OleaClara J Sedler is an 80 y.o. female with a PMH of recent diverticular bleeding, hypertension, dyslipidemia and arthritis who was admitted 09/28/14 with bright red blood per rectum in the setting of aspirin/NSAID use.  Assessment/Plan:   Principal Problem:   Rectal bleeding / diverticular bleeding / acute blood loss anemia  Likely precipitated by aspirin/NSAID use.  Avoid aspirin/NSAIDs and monitor.  Continue to monitor H and H checks, and transfuse as needed.  Status post 3 units of PRBCs.  Slight drop in hemoglobin overnight, transfuse 1 unit.  GI following but signed off.  May need to call them back if she has ongoing GI bleeding.  Active Problems:   Hyperlipidemia  Continue Crestor.    Acute on chronic renal failure  Baseline creatinine 1.08. Current creatinine elevated over usual baseline values, likely related to hypotension from blood loss. Creatinine improving with IV fluids.    DVT Prophylaxis  SCDs ordered.  Code Status: Full. Family Communication: No family at the bedside. Disposition Plan: Home when stable.   IV Access:    Peripheral IV   Procedures and diagnostic studies:   No results found.   Medical Consultants:    Dr. Charna ElizabethJyothi Mann, GI  Anti-Infectives:    None.  Subjective:   Ziza Cathie HoopsJ Sigley reports straining at her stools this morning, and that her stool was black and bloody.  No abdominal pain.  Tolerating her diet.    Objective:    Filed Vitals:   10/01/14 0453 10/01/14 1314 10/01/14 2051 10/02/14 0548  BP: 122/50 134/55 112/60 110/66  Pulse: 60 67 70 60  Temp: 97.9 F (36.6 C) 97.6 F (36.4 C) 97.7 F (36.5 C) 97.8 F (36.6 C)  TempSrc: Oral Oral Oral Oral  Resp: 18 16 16 16   Height:      Weight: 93 kg (205 lb 0.4 oz)   92.942 kg (204 lb 14.4 oz)  SpO2: 100% 96% 100% 100%     Intake/Output Summary (Last 24 hours) at 10/02/14 1109 Last data filed at 10/02/14 0600  Gross per 24 hour  Intake    720 ml  Output      0 ml  Net    720 ml    Exam: Gen:  NAD Cardiovascular:  RRR, No M/R/G Respiratory:  Lungs CTAB Gastrointestinal:  Abdomen soft, NT/ND, + BS Extremities:  No C/E/C   Data Reviewed:    Labs: Basic Metabolic Panel:  Recent Labs Lab 09/28/14 2253 09/29/14 0431 09/30/14 0535  NA 143 146 143  K 3.3* 3.4* 4.4  CL 103 106 110  CO2 24 26 24   GLUCOSE 118* 108* 102*  BUN 29* 29* 27*  CREATININE 1.76* 1.54* 1.30*  CALCIUM 9.5 8.9 8.2*   GFR Estimated Creatinine Clearance: 36.6 mL/min (by C-G formula based on Cr of 1.3). Liver Function Tests:  Recent Labs Lab 09/28/14 2253 09/29/14 0431  AST 17 14  ALT 14 11  ALKPHOS 100 82  BILITOT <0.2* <0.2*  PROT 7.0 5.7*  ALBUMIN 3.3* 2.7*   Coagulation profile  Recent Labs Lab 09/28/14 2253 09/29/14 0431  INR 1.03 1.13    CBC:  Recent Labs Lab 09/28/14 2253 09/29/14 0431  09/30/14 0535  09/30/14 1545 10/01/14 0030 10/01/14 0545 10/01/14 1258 10/02/14 0528  WBC 5.6 5.6  --  7.5  --   --   --   --   --   --  NEUTROABS 3.7  --   --   --   --   --   --   --   --   --   HGB 8.7* 7.2*  < > 7.7*  < > 7.9* 8.1* 7.6* 8.5* 7.6*  HCT 26.6* 22.2*  < > 22.8*  < > 24.1* 24.1* 22.6* 25.5* 22.8*  MCV 94.3 94.9  --  91.2  --   --   --   --   --   --   PLT 286 228  --  158  --   --   --   --   --   --   < > = values in this interval not displayed. BNP (last 3 results)  Recent Labs  06/08/14 1537  PROBNP 453.9*   Microbiology No results found for this or any previous visit (from the past 240 hour(s)).   Medications:   . sodium chloride   Intravenous Once  . sodium chloride   Intravenous Once  . pantoprazole  40 mg Oral BID AC  . polyethylene glycol  17 g Oral Daily  . rosuvastatin  10 mg Oral QHS   Continuous Infusions:   Time spent: 25 minutes.   LOS: 4 days    Jessel Gettinger  [facility] Pager [phone]. If unable to reach me by pager, please call my cell phone at [phone].  *Please refer to [email or web address], password [email or web address] to get updated schedule on who will round on this patient, as hospitalists switch teams weekly. If 7PM-7AM, please contact night-coverage at [email or web address], password [email or web address] for any overnight needs.  10/02/2014, 11:09 AM

## 2014-10-02 NOTE — Plan of Care (Signed)
Problem: Phase I Progression Outcomes Goal: Initial discharge plan identified Outcome: Completed/Met Date Met:  10/02/14  Problem: Phase II Progression Outcomes Goal: Progress activity as tolerated unless otherwise ordered Outcome: Completed/Met Date Met:  10/02/14 Goal: Discharge plan established Outcome: Completed/Met Date Met:  10/02/14 Goal: IV changed to normal saline lock Outcome: Completed/Met Date Met:  10/02/14 Goal: Obtain order to discontinue catheter if appropriate Outcome: Not Applicable Date Met:  10/62/69

## 2014-10-03 LAB — TYPE AND SCREEN
ABO/RH(D): AB POS
Antibody Screen: NEGATIVE
UNIT DIVISION: 0
Unit division: 0
Unit division: 0
Unit division: 0

## 2014-10-03 LAB — CBC
HEMATOCRIT: 27.1 % — AB (ref 36.0–46.0)
Hemoglobin: 9 g/dL — ABNORMAL LOW (ref 12.0–15.0)
MCH: 30.7 pg (ref 26.0–34.0)
MCHC: 33.2 g/dL (ref 30.0–36.0)
MCV: 92.5 fL (ref 78.0–100.0)
PLATELETS: 178 10*3/uL (ref 150–400)
RBC: 2.93 MIL/uL — AB (ref 3.87–5.11)
RDW: 15.8 % — ABNORMAL HIGH (ref 11.5–15.5)
WBC: 6.7 10*3/uL (ref 4.0–10.5)

## 2014-10-03 LAB — BASIC METABOLIC PANEL
Anion gap: 8 (ref 5–15)
BUN: 18 mg/dL (ref 6–23)
CO2: 27 mEq/L (ref 19–32)
CREATININE: 1.41 mg/dL — AB (ref 0.50–1.10)
Calcium: 9.1 mg/dL (ref 8.4–10.5)
Chloride: 109 mEq/L (ref 96–112)
GFR calc Af Amer: 40 mL/min — ABNORMAL LOW (ref >90)
GFR calc non Af Amer: 34 mL/min — ABNORMAL LOW (ref >90)
Glucose, Bld: 102 mg/dL — ABNORMAL HIGH (ref 70–99)
Potassium: 5.4 mEq/L — ABNORMAL HIGH (ref 3.7–5.3)
Sodium: 144 mEq/L (ref 137–147)

## 2014-10-03 MED ORDER — ACETAMINOPHEN 325 MG PO TABS: 650.0000 mg | ORAL_TABLET | Freq: Four times a day (QID) | ORAL | Status: AC | PRN

## 2014-10-03 MED ORDER — POLYETHYLENE GLYCOL 3350 17 G PO PACK: 17.0000 g | PACK | Freq: Every day | ORAL | Status: AC

## 2014-10-03 NOTE — Discharge Instructions (Signed)
Bloody Stools  Bloody stools often mean that there is a problem in the digestive tract. Your caregiver may use the term "melena" to describe black, tarry, and bad smelling stools or "hematochezia" to describe red or maroon-colored stools. Blood seen in the stool can be caused by bleeding anywhere along the intestinal tract.   A black stool usually means that blood is coming from the upper part of the gastrointestinal tract (esophagus, stomach, or small bowel). Passing maroon-colored stools or bright red blood usually means that blood is coming from lower down in the large bowel or the rectum. However, sometimes massive bleeding in the stomach or small intestine can cause bright red bloody stools.   Consuming black licorice, lead, iron pills, medicines containing bismuth subsalicylate, or blueberries can also cause black stools. Your caregiver can test black stools to see if blood is present.  It is important that the cause of the bleeding be found. Treatment can then be started, and the problem can be corrected. Rectal bleeding may not be serious, but you should not assume everything is okay until you know the cause. It is very important to follow up with your caregiver or a specialist in gastrointestinal problems.  CAUSES   Blood in the stools can come from various underlying causes. Often, the cause is not found during your first visit. Testing is often needed to discover the cause of bleeding in the gastrointestinal tract. Causes range from simple to serious or even life-threatening. Possible causes include:  · Hemorrhoids. These are veins that are full of blood (engorged) in the rectum. They cause pain, inflammation, and may bleed.  · Anal fissures. These are areas of painful tearing which may bleed. They are often caused by passing hard stool.  · Diverticulosis. These are pouches that form on the colon over time, with age, and may bleed significantly.  · Diverticulitis. This is inflammation in areas with  diverticulosis. It can cause pain, fever, and bloody stools, although bleeding is rare.  · Proctitis and colitis. These are inflamed areas of the rectum or colon. They may cause pain, fever, and bloody stools.  · Polyps and cancer. Colon cancer is a leading cause of preventable cancer death. It often starts out as precancerous polyps that can be removed during a colonoscopy, preventing progression into cancer. Sometimes, polyps and cancer may cause rectal bleeding.  · Gastritis and ulcers. Bleeding from the upper gastrointestinal tract (near the stomach) may travel through the intestines and produce black, sometimes tarry, often bad smelling stools. In certain cases, if the bleeding is fast enough, the stools may not be black, but red and the condition may be life-threatening.  SYMPTOMS   You may have stools that are bright red and bloody, that are normal color with blood on them, or that are dark black and tarry. In some cases, you may only have blood in the toilet bowl. Any of these cases need medical care. You may also have:  · Pain at the anus or anywhere in the rectum.  · Lightheadedness or feeling faint.  · Extreme weakness.  · Nausea or vomiting.  · Fever.  DIAGNOSIS  Your caregiver may use the following methods to find the cause of your bleeding:  · Taking a medical history. Age is important. Older people tend to develop polyps and cancer more often. If there is anal pain and a hard, large stool associated with bleeding, a tear of the anus may be the cause. If blood drips into the toilet after a bowel movement, bleeding hemorrhoids may be the   problem. The color and frequency of the bleeding are additional considerations. In most cases, the medical history provides clues, but seldom the final answer.  · A visual and finger (digital) exam. Your caregiver will inspect the anal area, looking for tears and hemorrhoids. A finger exam can provide information when there is tenderness or a growth inside. In men, the  prostate is also examined.  · Endoscopy. Several types of small, long scopes (endoscopes) are used to view the colon.  ¨ In the office, your caregiver may use a rigid, or more commonly, a flexible viewing sigmoidoscope. This exam is called flexible sigmoidoscopy. It is performed in 5 to 10 minutes.  ¨ A more thorough exam is accomplished with a colonoscope. It allows your caregiver to view the entire 5 to 6 foot long colon. Medicine to help you relax (sedative) is usually given for this exam. Frequently, a bleeding lesion may be present beyond the reach of the sigmoidoscope. So, a colonoscopy may be the best exam to start with. Both exams are usually done on an outpatient basis. This means the patient does not stay overnight in the hospital or surgery center.  ¨ An upper endoscopy may be needed to examine your stomach. Sedation is used and a flexible endoscope is put in your mouth, down to your stomach.  · A barium enema X-ray. This is an X-ray exam. It uses liquid barium inserted by enema into the rectum. This test alone may not identify an actual bleeding point. X-rays highlight abnormal shadows, such as those made by lumps (tumors), diverticuli, or colitis.  TREATMENT   Treatment depends on the cause of your bleeding.   · For bleeding from the stomach or colon, the caregiver doing your endoscopy or colonoscopy may be able to stop the bleeding as part of the procedure.  · Inflammation or infection of the colon can be treated with medicines.  · Many rectal problems can be treated with creams, suppositories, or warm baths.  · Surgery is sometimes needed.  · Blood transfusions are sometimes needed if you have lost a lot of blood.  · For any bleeding problem, let your caregiver know if you take aspirin or other blood thinners regularly.  HOME CARE INSTRUCTIONS   · Take any medicines exactly as prescribed.  · Keep your stools soft by eating a diet high in fiber. Prunes (1 to 3 a day) work well for many people.  · Drink  enough water and fluids to keep your urine clear or pale yellow.  · Take sitz baths if advised. A sitz bath is when you sit in a bathtub with warm water for 10 to 15 minutes to soak, soothe, and cleanse the rectal area.  · If enemas or suppositories are advised, be sure you know how to use them. Tell your caregiver if you have problems with this.  · Monitor your bowel movements to look for signs of improvement or worsening.  SEEK MEDICAL CARE IF:   · You do not improve in the time expected.  · Your condition worsens after initial improvement.  · You develop any new symptoms.  SEEK IMMEDIATE MEDICAL CARE IF:   · You develop severe or prolonged rectal bleeding.  · You vomit blood.  · You feel weak or faint.  · You have a fever.  MAKE SURE YOU:  · Understand these instructions.  · Will watch your condition.  · Will get help right away if you are not doing well or get worse.    Document Released: 10/06/2002 Document Revised: 01/08/2012 Document Reviewed: 03/03/2011  [employer]® Patient Information ©2015 [employer]. This information is not intended to replace advice given to you by your health care provider. Make sure you discuss any questions you have with your health care provider.

## 2014-10-03 NOTE — Progress Notes (Signed)
Patient discharged home, all discharge medications and instructions reviewed and questions answered. Patient to be assisted to vehicle by wheelchair.  

## 2014-10-03 NOTE — Discharge Summary (Signed)
Physician Discharge Summary  Marguerite OleaClara J Marsee WUJ:[id number]:[id number] DOB: 03/17/1932 DOA: 09/28/2014  PCP: [name],[name], MD  Admit date: 09/28/2014 Discharge date: 10/03/2014   Recommendations for Outpatient Follow-Up:   1. F/U in PCP in 1 week for a re-check of CBC and BMET.   Discharge Diagnosis:   Principal Problem:    Rectal bleeding, diverticular Active Problems:    Hypertension goal BP (blood pressure) < 130/80    Chronic kidney disease (CKD), stage III (moderate)    Hyperlipidemia    Acute on chronic renal failure    Lower GI bleed    Acute blood loss anemia   Discharge Condition: Improved.  Diet recommendation: Low sodium, heart healthy.     History of Present Illness:   Marguerite OleaClara J Fout is an 80 y.o. female with a PMH of recent diverticular bleeding, hypertension, dyslipidemia and arthritis who was admitted 09/28/14 with bright red blood per rectum in the setting of aspirin/NSAID use.  Hospital Course by Problem:   Principal Problem:  Rectal bleeding / diverticular bleeding / acute blood loss anemia  Likely precipitated by aspirin/NSAID use.  Instructed to avoid aspirin/NSAIDs.  Status post 4 units of PRBCs. D/C hemoglobin 9 mg/dL.  No invasive GI evaluation performed although she was evaluated by GI while here.  Active Problems:  Hyperlipidemia  Continue Crestor.   Acute on chronic renal failure  Baseline creatinine 1.08.  Creatinine improved with IVF.  Recommend close outpatient follow up.    Medical Consultants:    None.   Discharge Exam:   Filed Vitals:   10/03/14 0709  BP: 130/62  Pulse: 62  Temp: 97.8 F (36.6 C)  Resp: 16   Filed Vitals:   10/02/14 1245 10/02/14 1455 10/02/14 2055 10/03/14 0709  BP: 136/45 137/50 146/61 130/62  Pulse: 61 66 62 62  Temp: 97.8 F (36.6 C) 98.5 F (36.9 C) 98 F (36.7 C) 97.8 F (36.6 C)  TempSrc: Oral Oral Oral Oral  Resp: 16 16 16 16   Height:      Weight:    90.9 kg (200  lb 6.4 oz)  SpO2: 100%  100% 100%    Gen:  NAD Cardiovascular:  RRR, No M/R/G Respiratory: Lungs CTAB Gastrointestinal: Abdomen soft, NT/ND with normal active bowel sounds. Extremities: No C/E/C   The results of significant diagnostics from this hospitalization (including imaging, microbiology, ancillary and laboratory) are listed below for reference.     Procedures and Diagnostic Studies:    Dr. Charna ElizabethJyothi Mann, GI   Labs:   Basic Metabolic Panel:  Recent Labs Lab 09/28/14 2253 09/29/14 0431 09/30/14 0535 10/03/14 0517  NA 143 146 143 144  K 3.3* 3.4* 4.4 5.4*  CL 103 106 110 109  CO2 24 26 24 27   GLUCOSE 118* 108* 102* 102*  BUN 29* 29* 27* 18  CREATININE 1.76* 1.54* 1.30* 1.41*  CALCIUM 9.5 8.9 8.2* 9.1   GFR Estimated Creatinine Clearance: 33.4 mL/min (by C-G formula based on Cr of 1.41). Liver Function Tests:  Recent Labs Lab 09/28/14 2253 09/29/14 0431  AST 17 14  ALT 14 11  ALKPHOS 100 82  BILITOT <0.2* <0.2*  PROT 7.0 5.7*  ALBUMIN 3.3* 2.7*   Coagulation profile  Recent Labs Lab 09/28/14 2253 09/29/14 0431  INR 1.03 1.13    CBC:  Recent Labs Lab 09/28/14 2253 09/29/14 0431  09/30/14 0535  10/01/14 0030 10/01/14 0545 10/01/14 1258 10/02/14 0528 10/03/14 0517  WBC 5.6 5.6  --  7.5  --   --   --   --   --  6.7  NEUTROABS 3.7  --   --   --   --   --   --   --   --   --   HGB 8.7* 7.2*  < > 7.7*  < > 8.1* 7.6* 8.5* 7.6* 9.0*  HCT 26.6* 22.2*  < > 22.8*  < > 24.1* 22.6* 25.5* 22.8* 27.1*  MCV 94.3 94.9  --  91.2  --   --   --   --   --  92.5  PLT 286 228  --  158  --   --   --   --   --  178  < > = values in this interval not displayed.   Discharge Instructions:   Discharge Instructions    Diet general    Complete by:  As directed      Discharge instructions    Complete by:  As directed   Avoid constipation.  If your bowels move despite taking MiraLAX daily, increase the dose to twice a day.  If your stools are too loose, can  use MiraLAX every other day.  Call your doctor if you notice any ongoing black or bloody stools.  No aspirin or over the counter pain relievers except for tylenol for at least 2 weeks.  You were cared for by Dr. Hillery Aldohristina Jadan Hinojos  (a hospitalist) during your hospital stay. If you have any questions about your discharge medications or the care you received while you were in the hospital after you are discharged, you can call the unit and ask to speak with the hospitalist on call if the hospitalist that took care of you is not available. Once you are discharged, your primary care physician will handle any further medical issues. Please note that NO REFILLS for any discharge medications will be authorized once you are discharged, as it is imperative that you return to your primary care physician (or establish a relationship with a primary care physician if you do not have one) for your aftercare needs so that they can reassess your need for medications and monitor your lab values.  Any outstanding tests can be reviewed by your PCP at your follow up visit.  It is also important to review any medicine changes with your PCP.  Please bring these d/c instructions with you to your next visit so your physician can review these changes with you.     Increase activity slowly    Complete by:  As directed             Medication List    STOP taking these medications        aspirin EC 81 MG tablet      TAKE these medications        acetaminophen 325 MG tablet  Commonly known as:  TYLENOL  Take 2 tablets (650 mg total) by mouth every 6 (six) hours as needed for mild pain (or Fever >/= 101).     fluorometholone 0.1 % ophthalmic suspension  Commonly known as:  FML  Place 1 drop into both eyes as needed.     furosemide 40 MG tablet  Commonly known as:  LASIX  Take 40 mg by mouth daily.     lisinopril 10 MG tablet  Commonly known as:  PRINIVIL,ZESTRIL  Take 10 mg by mouth daily.     multivitamin with  minerals Tabs tablet  Take 1 tablet by mouth daily.     OMEGA 3 PO  Take 1 capsule by  mouth daily.     pantoprazole 40 MG tablet  Commonly known as:  PROTONIX  Take 1 tablet (40 mg total) by mouth daily.     polyethylene glycol packet  Commonly known as:  MIRALAX / GLYCOLAX  Take 17 g by mouth daily.     rosuvastatin 10 MG tablet  Commonly known as:  CRESTOR  Take 10 mg by mouth at bedtime.           Follow-up Information    Follow up with Michiel Sites, MD. Schedule an appointment as soon as possible for a visit in 1 week.   Specialty:  Endocrinology   Why:  To check your blood counts.   Contact information:   [address] [place] [address] [phone]        Time coordinating discharge: 35 minutes.  Signed:  Aedyn Kempfer  Pager [phone] [facility] 10/03/2014, 12:59 PM

## 2014-10-04 NOTE — Op Note (Signed)
[facility] [address] [place], [id number]   COLONOSCOPY PROCEDURE REPORT  PATIENT: Loretta Hartman, [name]  MR#: [id number] BIRTHDATE: [birth year] , 82  yrs. old GENDER: female ENDOSCOPIST: Jeani HawkingPatrick Luca Burston, MD REFERRED BY: PROCEDURE DATE:  09/23/2014 PROCEDURE:   Colonoscopy, diagnostic ASA CLASS:   Class III INDICATIONS:hematochezia. MEDICATIONS: Versed 3 mg IV and Fentanyl 25 mcg IV  DESCRIPTION OF PROCEDURE:   After the risks and benefits and of the procedure were explained, informed consent was obtained.  revealed no abnormalities of the rectum.    The     endoscope was introduced through the anus and advanced to the cecum, which was identified by both the appendix and ileocecal valve .  The quality of the prep was good. .  The instrument was then slowly withdrawn as the colon was fully examined.     FINDINGS: Fresh blood was noted in the left side of the colon, however, no further blood was identified from the distal transverse colon to the cecum.  No diverticula were noted in the proximal to the splenic flexure.  Copious amounts of washing was performed, but there was no reaccumulation of blood.  Extensive diverticula were noted in the left side of the colon and the highest concentration was in the sigmoid colon.  No evidence of any inflammation, ulcerations, erosions, polyps, masses, or vascular abnormalities. Retroflexed views revealed no abnormalities.     The scope was then withdrawn from the patient and the procedure completed.  WITHDRAWAL TIME:  COMPLICATIONS: There were no immediate complications. ENDOSCOPIC IMPRESSION: 1) Extensive left sided diverticula.  RECOMMENDATIONS: 1)  Follow HGB and transfuse as necessary. 2) If bleeding recurs/persists a bleeding scan and/or angiography will be required.  REPEAT EXAM:  cc:  _______________________________ eSignedJeani Hawking:  Chisom Muntean, MD 09/23/2014 11:54 AM   CPT CODES: ICD CODES:  The ICD and  CPT codes recommended by this software are interpretations from the data that the clinical staff has captured with the software.  The verification of the translation of this report to the ICD and CPT codes and modifiers is the sole responsibility of the health care institution and practicing physician where this report was generated.  [employer] will not be held responsible for the validity of the ICD and CPT codes included on this report.  AMA assumes no liability for data contained or not contained herein. CPT is a Publishing rights managerregistered trademark of the [employer].

## 2014-11-04 DIAGNOSIS — I10 Essential (primary) hypertension: Secondary | ICD-10-CM | POA: Diagnosis not present

## 2014-11-09 DIAGNOSIS — I10 Essential (primary) hypertension: Secondary | ICD-10-CM | POA: Diagnosis not present

## 2014-11-09 DIAGNOSIS — E789 Disorder of lipoprotein metabolism, unspecified: Secondary | ICD-10-CM | POA: Diagnosis not present

## 2014-11-09 DIAGNOSIS — K5901 Slow transit constipation: Secondary | ICD-10-CM | POA: Diagnosis not present

## 2015-01-07 DIAGNOSIS — K5901 Slow transit constipation: Secondary | ICD-10-CM | POA: Diagnosis not present

## 2015-01-14 DIAGNOSIS — I10 Essential (primary) hypertension: Secondary | ICD-10-CM | POA: Diagnosis not present

## 2015-01-14 DIAGNOSIS — K219 Gastro-esophageal reflux disease without esophagitis: Secondary | ICD-10-CM | POA: Diagnosis not present

## 2015-01-14 DIAGNOSIS — E789 Disorder of lipoprotein metabolism, unspecified: Secondary | ICD-10-CM | POA: Diagnosis not present

## 2015-02-08 DIAGNOSIS — E789 Disorder of lipoprotein metabolism, unspecified: Secondary | ICD-10-CM | POA: Diagnosis not present

## 2015-02-08 DIAGNOSIS — Z0000 Encounter for general adult medical examination without abnormal findings: Secondary | ICD-10-CM | POA: Diagnosis not present

## 2015-02-15 DIAGNOSIS — I10 Essential (primary) hypertension: Secondary | ICD-10-CM | POA: Diagnosis not present

## 2015-02-15 DIAGNOSIS — E789 Disorder of lipoprotein metabolism, unspecified: Secondary | ICD-10-CM | POA: Diagnosis not present

## 2015-02-15 DIAGNOSIS — K219 Gastro-esophageal reflux disease without esophagitis: Secondary | ICD-10-CM | POA: Diagnosis not present

## 2015-05-04 DIAGNOSIS — Z1231 Encounter for screening mammogram for malignant neoplasm of breast: Secondary | ICD-10-CM | POA: Diagnosis not present

## 2015-07-07 DIAGNOSIS — H3531 Nonexudative age-related macular degeneration: Secondary | ICD-10-CM | POA: Diagnosis not present

## 2015-07-07 DIAGNOSIS — H04223 Epiphora due to insufficient drainage, bilateral lacrimal glands: Secondary | ICD-10-CM | POA: Diagnosis not present

## 2015-08-10 DIAGNOSIS — E789 Disorder of lipoprotein metabolism, unspecified: Secondary | ICD-10-CM | POA: Diagnosis not present

## 2015-08-10 DIAGNOSIS — I10 Essential (primary) hypertension: Secondary | ICD-10-CM | POA: Diagnosis not present

## 2015-08-23 DIAGNOSIS — M7989 Other specified soft tissue disorders: Secondary | ICD-10-CM | POA: Diagnosis not present

## 2015-08-23 DIAGNOSIS — Z23 Encounter for immunization: Secondary | ICD-10-CM | POA: Diagnosis not present

## 2015-08-23 DIAGNOSIS — M7020 Olecranon bursitis, unspecified elbow: Secondary | ICD-10-CM | POA: Diagnosis not present

## 2015-09-10 DIAGNOSIS — M7020 Olecranon bursitis, unspecified elbow: Secondary | ICD-10-CM | POA: Diagnosis not present

## 2015-09-14 DIAGNOSIS — M7020 Olecranon bursitis, unspecified elbow: Secondary | ICD-10-CM | POA: Diagnosis not present

## 2015-11-04 DIAGNOSIS — E789 Disorder of lipoprotein metabolism, unspecified: Secondary | ICD-10-CM | POA: Diagnosis not present

## 2015-11-04 DIAGNOSIS — I10 Essential (primary) hypertension: Secondary | ICD-10-CM | POA: Diagnosis not present

## 2015-11-04 DIAGNOSIS — D649 Anemia, unspecified: Secondary | ICD-10-CM | POA: Diagnosis not present

## 2016-02-15 DIAGNOSIS — I10 Essential (primary) hypertension: Secondary | ICD-10-CM | POA: Diagnosis not present

## 2016-02-15 DIAGNOSIS — N390 Urinary tract infection, site not specified: Secondary | ICD-10-CM | POA: Diagnosis not present

## 2016-02-15 DIAGNOSIS — D649 Anemia, unspecified: Secondary | ICD-10-CM | POA: Diagnosis not present

## 2016-02-15 DIAGNOSIS — E789 Disorder of lipoprotein metabolism, unspecified: Secondary | ICD-10-CM | POA: Diagnosis not present

## 2016-02-22 DIAGNOSIS — K210 Gastro-esophageal reflux disease with esophagitis: Secondary | ICD-10-CM | POA: Diagnosis not present

## 2016-02-22 DIAGNOSIS — I10 Essential (primary) hypertension: Secondary | ICD-10-CM | POA: Diagnosis not present

## 2016-02-22 DIAGNOSIS — E789 Disorder of lipoprotein metabolism, unspecified: Secondary | ICD-10-CM | POA: Diagnosis not present

## 2016-02-22 DIAGNOSIS — D649 Anemia, unspecified: Secondary | ICD-10-CM | POA: Diagnosis not present

## 2016-05-24 ENCOUNTER — Encounter (HOSPITAL_COMMUNITY): Payer: Self-pay

## 2016-05-24 ENCOUNTER — Emergency Department (HOSPITAL_COMMUNITY)
Admission: EM | Admit: 2016-05-24 | Discharge: 2016-05-24 | Disposition: A | Discharge status: Home / Self Care | Payer: Medicare Other | Attending: Emergency Medicine | Admitting: Emergency Medicine

## 2016-05-24 DIAGNOSIS — Z79899 Other long term (current) drug therapy: Secondary | ICD-10-CM | POA: Insufficient documentation

## 2016-05-24 DIAGNOSIS — E785 Hyperlipidemia, unspecified: Secondary | ICD-10-CM | POA: Insufficient documentation

## 2016-05-24 DIAGNOSIS — I1310 Hypertensive heart and chronic kidney disease without heart failure, with stage 1 through stage 4 chronic kidney disease, or unspecified chronic kidney disease: Secondary | ICD-10-CM | POA: Insufficient documentation

## 2016-05-24 DIAGNOSIS — N183 Chronic kidney disease, stage 3 (moderate): Secondary | ICD-10-CM | POA: Diagnosis not present

## 2016-05-24 DIAGNOSIS — Z87891 Personal history of nicotine dependence: Secondary | ICD-10-CM | POA: Diagnosis not present

## 2016-05-24 DIAGNOSIS — T783XXA Angioneurotic edema, initial encounter: Secondary | ICD-10-CM | POA: Diagnosis not present

## 2016-05-24 DIAGNOSIS — R600 Localized edema: Secondary | ICD-10-CM | POA: Diagnosis present

## 2016-05-24 NOTE — ED Triage Notes (Signed)
Pt took her lisinopril yesterday and woke up this am with lip swelling

## 2016-05-24 NOTE — ED Notes (Signed)
She is in no distress and has minimal lip swelling.  She denies any sensation of swelling in her throat or tongue and is breathing normally.

## 2016-05-24 NOTE — ED Provider Notes (Signed)
[facility]-EMERGENCY DEPT Provider Note   CSN: [id number] Arrival date & time: 05/24/16  6644  First Provider Contact:  First MD Initiated Contact with Patient 05/24/16 [phone]        History   Chief Complaint Chief Complaint  Patient presents with  . Oral Swelling    HPI Loretta Hartman is a 82 y.o. female who presents to the Emergency Department complaining of facial swelling.  Last night about 9 PM she developed swelling to her left lip and face. She denies any pain, injuries, itching. No throat swelling or tongue swelling. No prior similar episodes. No fevers, chest pain, shortness of breath. She takes lisinopril daily for hypertension. Her last dose was at 2 PM yesterday. She has been on the lisinopril for the last 3 years. She has a history of dental problems but this does not feel similar to her prior dental problems.Her swelling has overall decreased since it started yesterday.  The history is provided by the patient. No language interpreter was used.    Past Medical History:  Diagnosis Date  . Bronchitis   . Hyperlipidemia   . Hypertension     Patient Active Problem List   Diagnosis Date Noted  . Rectal bleeding 09/29/2014  . Acute blood loss anemia 09/22/2014  . Lower GI bleed   . Acute on chronic renal failure (HCC) 06/10/2014  . Rash and nonspecific skin eruption 06/10/2014  . Cellulitis of left lower extremity-failed outpatient management 06/08/2014  . Hypertension goal BP (blood pressure) < 130/80 06/08/2014  . Metabolic acidosis, increased anion gap (IAG) 06/08/2014  . Chronic kidney disease (CKD), stage III (moderate) 06/08/2014  . Hyperlipidemia 06/08/2014    Past Surgical History:  Procedure Laterality Date  . CHOLECYSTECTOMY    . COLONOSCOPY WITH PROPOFOL N/A 09/23/2014   Procedure: COLONOSCOPY WITH PROPOFOL;  Surgeon: Theda Belfast, MD;  Location: [facility] ENDOSCOPY;  Service: Endoscopy;  Laterality: N/A;  . TOTAL KNEE ARTHROPLASTY     bilateral    OB  History    No data available       Home Medications    Prior to Admission medications   Medication Sig Start Date End Date Taking? Authorizing Provider  acetaminophen (TYLENOL) 325 MG tablet Take 2 tablets (650 mg total) by mouth every 6 (six) hours as needed for mild pain (or Fever >/= 101). 10/03/14   Christina P Rama, MD  fluorometholone (FML) 0.1 % ophthalmic suspension Place 1 drop into both eyes as needed.  07/13/14   Historical Provider, MD  furosemide (LASIX) 40 MG tablet Take 40 mg by mouth daily.  09/02/14   Historical Provider, MD  Multiple Vitamin (MULTIVITAMIN WITH MINERALS) TABS tablet Take 1 tablet by mouth daily.    Historical Provider, MD  Omega-3 Fatty Acids (OMEGA 3 PO) Take 1 capsule by mouth daily.    Historical Provider, MD  pantoprazole (PROTONIX) 40 MG tablet Take 1 tablet (40 mg total) by mouth daily. 09/24/14   Richarda Overlie, MD  polyethylene glycol (MIRALAX / GLYCOLAX) packet Take 17 g by mouth daily. 10/03/14   Maryruth Bun Rama, MD  rosuvastatin (CRESTOR) 10 MG tablet Take 10 mg by mouth at bedtime.     Historical Provider, MD    Family History Family History  Problem Relation Age of Onset  . Cancer Father   . Cancer Brother   . Diabetes Brother     Social History Social History  Substance Use Topics  . Smoking status: Former Smoker  Years: 25.00    Quit date: 06/08/1990  . Smokeless tobacco: Current User    Types: Snuff  . Alcohol use No     Allergies   Penicillins; Lisinopril; and Clindamycin/lincomycin   Review of Systems Review of Systems  All other systems reviewed and are negative.    Physical Exam Updated Vital Signs BP 175/70 (BP Location: Left Arm)   Pulse 66   Temp 98.1 F (36.7 C) (Oral)   Resp 18   Ht  (1.575 m)   Wt 183 lb (83 kg)   SpO2 100%   BMI 33.47 kg/m   Physical Exam  Constitutional: She is oriented to person, place, and time. She appears well-developed and well-nourished.  HENT:  Head: Normocephalic  and atraumatic.  Mild swelling to the left upper lip and left jawline. There is no erythema or tenderness. No tongue swelling or swelling of the posterior oropharynx.  Neck: Neck supple.  Cardiovascular: Normal rate and regular rhythm.   No murmur heard. Pulmonary/Chest: Effort normal and breath sounds normal. No stridor. No respiratory distress.  Abdominal: Soft. There is no tenderness. There is no rebound and no guarding.  Musculoskeletal: She exhibits no tenderness.  Trace pitting edema bilateral lower extremities  Neurological: She is alert and oriented to person, place, and time.  Skin: Skin is warm and dry.  Psychiatric: She has a normal mood and affect. Her behavior is normal.  Nursing note and vitals reviewed.    ED Treatments / Results  Labs (all labs ordered are listed, but only abnormal results are displayed) Labs Reviewed - No data to display  EKG  EKG Interpretation None       Radiology No results found.  Procedures Procedures (including critical care time)  Medications Ordered in ED Medications - No data to display   Initial Impression / Assessment and Plan / ED Course  I have reviewed the triage vital signs and the nursing notes.  Pertinent labs & imaging results that were available during my care of the patient were reviewed by me and considered in my medical decision making (see chart for details).  Clinical Course      Final Clinical Impressions(s) / ED Diagnoses   Final diagnoses:  Angioedema, initial encounter   Patient here for evaluation of lip/face swelling. There is no evidence of acute infectious process at this time although she does have poor dentition. There is no involvement of the tongue or posterior oropharynx, no respiratory distress. Exam is consistent with angioedema, suspect related to her lisinopril. Recommend discontinuing her lisinopril with close outpatient follow-up. Discussed return precautions for evidence of infection or  worsening of her facial swelling.  New Prescriptions Discharge Medication List as of 05/24/2016  7:51 AM       Tilden Fossa, MD 05/24/16 [phone]

## 2016-07-03 ENCOUNTER — Encounter (HOSPITAL_COMMUNITY): Payer: Self-pay | Admitting: Emergency Medicine

## 2016-07-03 ENCOUNTER — Emergency Department (HOSPITAL_COMMUNITY)
Admission: EM | Admit: 2016-07-03 | Discharge: 2016-07-03 | Disposition: A | Discharge status: Home / Self Care | Payer: Medicare Other | Attending: Emergency Medicine | Admitting: Emergency Medicine

## 2016-07-03 DIAGNOSIS — Y829 Unspecified medical devices associated with adverse incidents: Secondary | ICD-10-CM | POA: Insufficient documentation

## 2016-07-03 DIAGNOSIS — T887XXA Unspecified adverse effect of drug or medicament, initial encounter: Secondary | ICD-10-CM | POA: Diagnosis not present

## 2016-07-03 DIAGNOSIS — N183 Chronic kidney disease, stage 3 (moderate): Secondary | ICD-10-CM | POA: Insufficient documentation

## 2016-07-03 DIAGNOSIS — Z79891 Long term (current) use of opiate analgesic: Secondary | ICD-10-CM | POA: Diagnosis not present

## 2016-07-03 DIAGNOSIS — I1310 Hypertensive heart and chronic kidney disease without heart failure, with stage 1 through stage 4 chronic kidney disease, or unspecified chronic kidney disease: Secondary | ICD-10-CM | POA: Insufficient documentation

## 2016-07-03 DIAGNOSIS — Z79899 Other long term (current) drug therapy: Secondary | ICD-10-CM | POA: Diagnosis not present

## 2016-07-03 DIAGNOSIS — Z87891 Personal history of nicotine dependence: Secondary | ICD-10-CM | POA: Diagnosis not present

## 2016-07-03 DIAGNOSIS — T50995A Adverse effect of other drugs, medicaments and biological substances, initial encounter: Secondary | ICD-10-CM | POA: Diagnosis not present

## 2016-07-03 DIAGNOSIS — T50905A Adverse effect of unspecified drugs, medicaments and biological substances, initial encounter: Secondary | ICD-10-CM

## 2016-07-03 DIAGNOSIS — R220 Localized swelling, mass and lump, head: Secondary | ICD-10-CM | POA: Diagnosis not present

## 2016-07-03 DIAGNOSIS — T368X5A Adverse effect of other systemic antibiotics, initial encounter: Secondary | ICD-10-CM | POA: Diagnosis present

## 2016-07-03 HISTORY — DX: Unspecified osteoarthritis, unspecified site: M19.90

## 2016-07-03 MED ORDER — PREDNISONE 20 MG PO TABS: ORAL_TABLET | ORAL | 0 refills | Status: DC

## 2016-07-03 NOTE — Discharge Instructions (Signed)
Return for fever > 100.4, blisters in your mouth, pain to the vagina or rectum

## 2016-07-03 NOTE — ED Notes (Signed)
Pt refuses transfer via wheelchair en route to room. Pt using cane with ambulation; steady gait. No airway obstruction noted with reported oral swelling. Pt speaking complete sentences without signs of distress. Pt breathing unlabored.

## 2016-07-03 NOTE — ED Provider Notes (Signed)
[facility]-EMERGENCY DEPT Provider Note   CSN: [id number] Arrival date & time: 07/03/16  4098     History   Chief Complaint Chief Complaint  Patient presents with  . Medication Reaction    HPI Loretta Hartman is a 82 y.o. female.  82 yo F with a cc of lip swelling.  Going on for past day.  Just started on bactrim for hematuria. Patient is unsure if she's been treated for urinary tract infection. I'm unable to see the lab results for that or the notes. Patient denies any fevers, is having some subjective chills. Patient denies any blistering denies any vaginal discomfort denies dysuria denies rectal pain.   The history is provided by the patient.  Illness  This is a new problem. The current episode started less than 1 hour ago. The problem occurs constantly. The problem has not changed since onset.Pertinent negatives include no chest pain, no headaches and no shortness of breath. Nothing aggravates the symptoms. Nothing relieves the symptoms. She has tried nothing for the symptoms.    Past Medical History:  Diagnosis Date  . Arthritis   . Bronchitis   . Hyperlipidemia   . Hypertension     Patient Active Problem List   Diagnosis Date Noted  . Rectal bleeding 09/29/2014  . Acute blood loss anemia 09/22/2014  . Lower GI bleed   . Acute on chronic renal failure (HCC) 06/10/2014  . Rash and nonspecific skin eruption 06/10/2014  . Cellulitis of left lower extremity-failed outpatient management 06/08/2014  . Hypertension goal BP (blood pressure) < 130/80 06/08/2014  . Metabolic acidosis, increased anion gap (IAG) 06/08/2014  . Chronic kidney disease (CKD), stage III (moderate) 06/08/2014  . Hyperlipidemia 06/08/2014    Past Surgical History:  Procedure Laterality Date  . CHOLECYSTECTOMY    . COLONOSCOPY WITH PROPOFOL N/A 09/23/2014   Procedure: COLONOSCOPY WITH PROPOFOL;  Surgeon: Theda Belfast, MD;  Location: [facility] ENDOSCOPY;  Service: Endoscopy;  Laterality: N/A;  . TOTAL KNEE  ARTHROPLASTY     bilateral    OB History    No data available       Home Medications    Prior to Admission medications   Medication Sig Start Date End Date Taking? Authorizing Provider  acetaminophen (TYLENOL) 325 MG tablet Take 2 tablets (650 mg total) by mouth every 6 (six) hours as needed for mild pain (or Fever >/= 101). 10/03/14  Yes Christina P Rama, MD  amLODipine (NORVASC) 5 MG tablet Take 5 mg by mouth daily. 06/27/16  Yes Historical Provider, MD  fluorometholone (FML) 0.1 % ophthalmic suspension Place 1 drop into both eyes daily as needed (irritation).  07/13/14  Yes Historical Provider, MD  furosemide (LASIX) 40 MG tablet Take 40 mg by mouth daily.  09/02/14  Yes Historical Provider, MD  Multiple Vitamin (MULTIVITAMIN WITH MINERALS) TABS tablet Take 1 tablet by mouth daily.   Yes Historical Provider, MD  Omega-3 Fatty Acids (OMEGA 3 PO) Take 1 capsule by mouth every other day.    Yes Historical Provider, MD  pantoprazole (PROTONIX) 40 MG tablet Take 1 tablet (40 mg total) by mouth daily. 09/24/14  Yes Richarda Overlie, MD  polyethylene glycol (MIRALAX / GLYCOLAX) packet Take 17 g by mouth daily. Patient taking differently: Take 17 g by mouth daily as needed for moderate constipation.  10/03/14  Yes Christina P Rama, MD  rosuvastatin (CRESTOR) 10 MG tablet Take 10 mg by mouth at bedtime.    Yes Historical Provider, MD  sulfamethoxazole-trimethoprim (  BACTRIM DS,SEPTRA DS) 800-160 MG tablet Take 1 tablet by mouth daily.   Yes Historical Provider, MD  predniSONE (DELTASONE) 20 MG tablet 2 tabs po daily x 4 days 07/03/16   Melene Planan Jerran Tappan, DO    Family History Family History  Problem Relation Age of Onset  . Cancer Father   . Cancer Brother   . Diabetes Brother     Social History Social History  Substance Use Topics  . Smoking status: Former Smoker    Years: 25.00    Quit date: 06/08/1990  . Smokeless tobacco: Current User    Types: Snuff  . Alcohol use No     Allergies     Penicillins; Lisinopril; and Clindamycin/lincomycin   Review of Systems Review of Systems  Constitutional: Negative for chills and fever.  HENT: Positive for facial swelling. Negative for congestion and rhinorrhea.   Eyes: Negative for redness and visual disturbance.  Respiratory: Negative for shortness of breath and wheezing.   Cardiovascular: Negative for chest pain and palpitations.  Gastrointestinal: Negative for nausea and vomiting.  Genitourinary: Negative for dysuria and urgency.  Musculoskeletal: Negative for arthralgias and myalgias.  Skin: Negative for pallor and wound.  Neurological: Negative for dizziness and headaches.     Physical Exam Updated Vital Signs BP 129/73 (BP Location: Left Arm)   Pulse 74   Temp 98.8 F (37.1 C) (Oral)   Ht 5\' 3"  (1.6 m)   Wt 184 lb (83.5 kg)   SpO2 93%   BMI 32.59 kg/m   Physical Exam  Constitutional: She is oriented to person, place, and time. She appears well-developed and well-nourished. No distress.  HENT:  Head: Normocephalic and atraumatic.  Mild erythema to lower lip without blistering, handling secretions  Eyes: EOM are normal. Pupils are equal, round, and reactive to light.  Neck: Normal range of motion. Neck supple.  Cardiovascular: Normal rate and regular rhythm.  Exam reveals no gallop and no friction rub.   No murmur heard. Pulmonary/Chest: Effort normal. She has no wheezes. She has no rales.  Abdominal: Soft. She exhibits no distension. There is no tenderness.  Musculoskeletal: She exhibits no edema or tenderness.  Neurological: She is alert and oriented to person, place, and time.  Skin: Skin is warm and dry. She is not diaphoretic.  Psychiatric: She has a normal mood and affect. Her behavior is normal.  Nursing note and vitals reviewed.    ED Treatments / Results  Labs (all labs ordered are listed, but only abnormal results are displayed) Labs Reviewed - No data to display  EKG  EKG  Interpretation None       Radiology No results found.  Procedures Procedures (including critical care time)  Medications Ordered in ED Medications - No data to display   Initial Impression / Assessment and Plan / ED Course  I have reviewed the triage vital signs and the nursing notes.  Pertinent labs & imaging results that were available during my care of the patient were reviewed by me and considered in my medical decision making (see chart for details).  Clinical Course    [age] yoF With a chief complaint of lip swelling. Patient has been seen here previously and diagnosed with angioedema. She is no longer on an ACE inhibitor. I'm concerned that with Bactrim use this may be early Stevens-Johnson syndrome. However she currently has no vesicles no appreciable swelling on my exam. He is afebrile. We'll have her call her family physician in the morning and discuss antibiotic  change. Will start her on a burst dose of steroids.  10:53 AM:  I have discussed the diagnosis/risks/treatment options with the patient and believe the pt to be eligible for discharge home to follow-up with PCP. We also discussed returning to the ED immediately if new or worsening sx occur. We discussed the sx which are most concerning (e.g., fever, blisters, vaginal pain, rectal pain) that necessitate immediate return. Medications administered to the patient during their visit and any new prescriptions provided to the patient are listed below.  Medications given during this visit Medications - No data to display   The patient appears reasonably screen and/or stabilized for discharge and I doubt any other medical condition or other [facility] requiring further screening, evaluation, or treatment in the ED at this time prior to discharge.    Final Clinical Impressions(s) / ED Diagnoses   Final diagnoses:  Medication reaction, initial encounter    New Prescriptions New Prescriptions   PREDNISONE (DELTASONE) 20 MG  TABLET    2 tabs po daily x 4 days     Melene Plan, DO 07/03/16 1053

## 2016-07-03 NOTE — ED Triage Notes (Signed)
Pt presents to ED with complaints of possible allergic reaction to blood pressure medication which she began taking on Friday. Pt states she has noticed fever/chills, oral swelling and dryness.

## 2016-07-11 DIAGNOSIS — I10 Essential (primary) hypertension: Secondary | ICD-10-CM | POA: Diagnosis not present

## 2016-07-27 DIAGNOSIS — R109 Unspecified abdominal pain: Secondary | ICD-10-CM | POA: Diagnosis not present

## 2016-07-27 DIAGNOSIS — N390 Urinary tract infection, site not specified: Secondary | ICD-10-CM | POA: Diagnosis not present

## 2016-08-01 DIAGNOSIS — Z23 Encounter for immunization: Secondary | ICD-10-CM | POA: Diagnosis not present

## 2016-08-01 DIAGNOSIS — E789 Disorder of lipoprotein metabolism, unspecified: Secondary | ICD-10-CM | POA: Diagnosis not present

## 2016-08-01 DIAGNOSIS — I10 Essential (primary) hypertension: Secondary | ICD-10-CM | POA: Diagnosis not present

## 2016-08-01 DIAGNOSIS — R319 Hematuria, unspecified: Secondary | ICD-10-CM | POA: Diagnosis not present

## 2016-08-15 DIAGNOSIS — N390 Urinary tract infection, site not specified: Secondary | ICD-10-CM | POA: Diagnosis not present

## 2017-02-22 DIAGNOSIS — Z0000 Encounter for general adult medical examination without abnormal findings: Secondary | ICD-10-CM | POA: Diagnosis not present

## 2017-02-22 DIAGNOSIS — N390 Urinary tract infection, site not specified: Secondary | ICD-10-CM | POA: Diagnosis not present

## 2017-02-22 DIAGNOSIS — E789 Disorder of lipoprotein metabolism, unspecified: Secondary | ICD-10-CM | POA: Diagnosis not present

## 2017-02-22 DIAGNOSIS — I10 Essential (primary) hypertension: Secondary | ICD-10-CM | POA: Diagnosis not present

## 2017-02-22 DIAGNOSIS — D649 Anemia, unspecified: Secondary | ICD-10-CM | POA: Diagnosis not present

## 2017-02-28 DIAGNOSIS — E789 Disorder of lipoprotein metabolism, unspecified: Secondary | ICD-10-CM | POA: Diagnosis not present

## 2017-02-28 DIAGNOSIS — I10 Essential (primary) hypertension: Secondary | ICD-10-CM | POA: Diagnosis not present

## 2017-06-11 DIAGNOSIS — Z1231 Encounter for screening mammogram for malignant neoplasm of breast: Secondary | ICD-10-CM | POA: Diagnosis not present

## 2017-11-30 ENCOUNTER — Emergency Department (HOSPITAL_COMMUNITY)
Admission: EM | Admit: 2017-11-30 | Discharge: 2017-11-30 | Disposition: A | Discharge status: Home / Self Care | Payer: Medicare Other | Attending: Emergency Medicine | Admitting: Emergency Medicine

## 2017-11-30 ENCOUNTER — Encounter (HOSPITAL_COMMUNITY): Payer: Self-pay

## 2017-11-30 DIAGNOSIS — Y929 Unspecified place or not applicable: Secondary | ICD-10-CM | POA: Insufficient documentation

## 2017-11-30 DIAGNOSIS — Y990 Civilian activity done for income or pay: Secondary | ICD-10-CM | POA: Diagnosis not present

## 2017-11-30 DIAGNOSIS — I129 Hypertensive chronic kidney disease with stage 1 through stage 4 chronic kidney disease, or unspecified chronic kidney disease: Secondary | ICD-10-CM | POA: Insufficient documentation

## 2017-11-30 DIAGNOSIS — Z79899 Other long term (current) drug therapy: Secondary | ICD-10-CM | POA: Diagnosis not present

## 2017-11-30 DIAGNOSIS — Z87891 Personal history of nicotine dependence: Secondary | ICD-10-CM | POA: Diagnosis not present

## 2017-11-30 DIAGNOSIS — R51 Headache: Secondary | ICD-10-CM | POA: Insufficient documentation

## 2017-11-30 DIAGNOSIS — W109XXA Fall (on) (from) unspecified stairs and steps, initial encounter: Secondary | ICD-10-CM | POA: Diagnosis not present

## 2017-11-30 DIAGNOSIS — N183 Chronic kidney disease, stage 3 (moderate): Secondary | ICD-10-CM | POA: Diagnosis not present

## 2017-11-30 DIAGNOSIS — Y9301 Activity, walking, marching and hiking: Secondary | ICD-10-CM | POA: Insufficient documentation

## 2017-11-30 DIAGNOSIS — S0081XA Abrasion of other part of head, initial encounter: Secondary | ICD-10-CM | POA: Diagnosis not present

## 2017-11-30 DIAGNOSIS — T148XXA Other injury of unspecified body region, initial encounter: Secondary | ICD-10-CM

## 2017-11-30 DIAGNOSIS — S0990XA Unspecified injury of head, initial encounter: Secondary | ICD-10-CM | POA: Insufficient documentation

## 2017-11-30 MED ORDER — ACETAMINOPHEN 325 MG PO TABS
650.0000 mg | ORAL_TABLET | Freq: Once | ORAL | Status: AC
  Administered 2017-11-30: 650 mg via ORAL
  Filled 2017-11-30: qty 2

## 2017-11-30 NOTE — ED Provider Notes (Signed)
[facility]-EMERGENCY DEPT Provider Note   CSN: [id number] Arrival date & time: 11/30/17  1311     History   Chief Complaint Chief Complaint  Patient presents with  . Fall    HPI Loretta Hartman is a 84 y.o. female who presents for evaluation of mechanical fall.  Patient reports that she is wearing her bifocals and was stepping outside when she missed a step, causing her to fall and landed on her left side.  Patient reports that she had the left side of her head.  Patient reports that she has a small abrasion where her glasses broke and hit the side of her head.  The fall was witnessed by family who reports no LOC.  Patient was able to get up immediately from the incident and was able to bear weight.  On ED arrival, patient is complaining of a slight headache.  Patient is not currently on any blood thinners.  Patient denies any vision changes, neck pain, back pain, chest pain, dizziness, abdominal pain, numbness/weakness of her arms or legs.  The history is provided by the patient.    Past Medical History:  Diagnosis Date  . Arthritis   . Bronchitis   . Hyperlipidemia   . Hypertension     Patient Active Problem List   Diagnosis Date Noted  . Rectal bleeding 09/29/2014  . Acute blood loss anemia 09/22/2014  . Lower GI bleed   . Acute on chronic renal failure (HCC) 06/10/2014  . Rash and nonspecific skin eruption 06/10/2014  . Cellulitis of left lower extremity-failed outpatient management 06/08/2014  . Hypertension goal BP (blood pressure) < 130/80 06/08/2014  . Metabolic acidosis, increased anion gap (IAG) 06/08/2014  . Chronic kidney disease (CKD), stage III (moderate) (HCC) 06/08/2014  . Hyperlipidemia 06/08/2014    Past Surgical History:  Procedure Laterality Date  . CHOLECYSTECTOMY    . COLONOSCOPY WITH PROPOFOL N/A 09/23/2014   Procedure: COLONOSCOPY WITH PROPOFOL;  Surgeon: Theda Belfast, MD;  Location: [facility];  Service: Endoscopy;   Laterality: N/A;  . TOTAL KNEE ARTHROPLASTY     bilateral    OB History    No data available       Home Medications    Prior to Admission medications   Medication Sig Start Date End Date Taking? Authorizing Provider  acetaminophen (TYLENOL) 325 MG tablet Take 2 tablets (650 mg total) by mouth every 6 (six) hours as needed for mild pain (or Fever >/= 101). 10/03/14   Rama, Maryruth Bun, MD  amLODipine (NORVASC) 5 MG tablet Take 5 mg by mouth daily. 06/27/16   [provider]  fluorometholone (FML) 0.1 % ophthalmic suspension Place 1 drop into both eyes daily as needed (irritation).  07/13/14   [provider]  furosemide (LASIX) 40 MG tablet Take 40 mg by mouth daily.  09/02/14   [provider]  Multiple Vitamin (MULTIVITAMIN WITH MINERALS) TABS tablet Take 1 tablet by mouth daily.    [provider]  Omega-3 Fatty Acids (OMEGA 3 PO) Take 1 capsule by mouth every other day.     [provider]  pantoprazole (PROTONIX) 40 MG tablet Take 1 tablet (40 mg total) by mouth daily. 09/24/14   Richarda Overlie, MD  polyethylene glycol (MIRALAX / GLYCOLAX) packet Take 17 g by mouth daily. Patient taking differently: Take 17 g by mouth daily as needed for moderate constipation.  10/03/14   Rama, Maryruth Bun, MD  predniSONE (DELTASONE) 20 MG tablet 2  tabs po daily x 4 days 07/03/16   Melene PlanFloyd, Dan, DO  rosuvastatin (CRESTOR) 10 MG tablet Take 10 mg by mouth at bedtime.     [provider]  sulfamethoxazole-trimethoprim (BACTRIM DS,SEPTRA DS) 800-160 MG tablet Take 1 tablet by mouth daily.    [provider]    Family History Family History  Problem Relation Age of Onset  . Cancer Father   . Cancer Brother   . Diabetes Brother     Social History Social History   Tobacco Use  . Smoking status: Former Smoker    Years: 25.00    Last attempt to quit: 06/08/1990    Years since quitting: 27.4  . Smokeless tobacco: Current User    Types: Snuff   Substance Use Topics  . Alcohol use: No  . Drug use: No     Allergies   Penicillins; Lisinopril; and Clindamycin/lincomycin   Review of Systems Review of Systems  Constitutional: Negative for chills and fever.  Eyes: Negative for visual disturbance.  Respiratory: Negative for cough and shortness of breath.   Cardiovascular: Negative for chest pain.  Gastrointestinal: Negative for abdominal pain, diarrhea, nausea and vomiting.  Genitourinary: Negative for dysuria and hematuria.  Musculoskeletal: Negative for back pain and neck pain.  Skin: Negative for rash.  Neurological: Positive for headaches. Negative for dizziness, weakness and numbness.  All other systems reviewed and are negative.    Physical Exam Updated Vital Signs BP (!) 157/87 (BP Location: Right Arm)   Pulse 62   Temp 98.3 F (36.8 C) (Oral)   Resp 17   SpO2 96%   Physical Exam  Constitutional: She is oriented to person, place, and time. She appears well-developed and well-nourished.  HENT:  Head: Normocephalic and atraumatic.  Mouth/Throat: Oropharynx is clear and moist and mucous membranes are normal.  No tenderness to palpation of skull. No deformities or crepitus noted. No open wounds, abrasions or lacerations.  Eyes: Conjunctivae, EOM and lids are normal. Pupils are equal, round, and reactive to light.  EOMs intact without difficulty   Neck: Full passive range of motion without pain.  Full flexion/extension and lateral movement of neck fully intact. No bony midline tenderness. No deformities or crepitus.   Cardiovascular: Normal rate, regular rhythm, normal heart sounds and normal pulses. Exam reveals no gallop and no friction rub.  No murmur heard. Pulses:      Radial pulses are 2+ on the right side, and 2+ on the left side.  Pulmonary/Chest: Effort normal and breath sounds normal.  Abdominal: Soft. Normal appearance. There is no tenderness. There is no rigidity and no guarding.  Musculoskeletal:  Normal range of motion.  Full range of motion bilateral lower extremities intact without any difficulty.  No tenderness palpation of bilateral hips, bilateral knees, bilateral ankles.  Neurological: She is alert and oriented to person, place, and time.  Cranial nerves III-XII intact Follows commands, Moves all extremities  5/5 strength to BUE and BLE  Sensation intact throughout all major nerve distributions Normal finger to nose. No dysdiadochokinesia. No pronator drift.  No slurred speech. No facial droop.   Skin: Skin is warm and dry. Capillary refill takes less than 2 seconds.  Small abrasion to the left side of head wear glasses broke and scraped her face.  Psychiatric: She has a normal mood and affect. Her speech is normal.  Nursing note and vitals reviewed.    ED Treatments / Results  Labs (all labs ordered are listed, but only abnormal  results are displayed) Labs Reviewed - No data to display  EKG  EKG Interpretation None       Radiology No results found.  Procedures Procedures (including critical care time)  Medications Ordered in ED Medications  acetaminophen (TYLENOL) tablet 650 mg (650 mg Oral Given 11/30/17 1347)     Initial Impression / Assessment and Plan / ED Course  I have reviewed the triage vital signs and the nursing notes.  Pertinent labs & imaging results that were available during my care of the patient were reviewed by me and considered in my medical decision making (see chart for details).     84 y.o. female who presents for evaluation after mechanical fall.  Patient missed a step, causing her to fall landing on her left side.  Patient states that she did hit the left side of her head.  Fall was witnessed and patient had no LOC.  Patient is not on any blood thinners.  On ED arrival, complaining of a headache. Patient is afebrile, non-toxic appearing, sitting comfortably on examination table. Vital signs reviewed and stable.  Patient is slightly  hypertensive.  Patient reports that she has not taken her blood pressure medication which she usually takes at 1 PM. No neuro deficits noted on exam.  Patient has no tenderness palpation to the midline C-spine.  She has full range of motion of C-spine without any difficulty.  EOMs are intact without any difficulty.  No evidence of entrapment.  We will plan to give analgesics for headache process.  At this time, given the patient had no LOC, is not on any blood thinners, no neuro deficits on exam, I am reassured by patient's presentation.  I discussed with patient and her son and engaged in shared decision making regarding obtaining CT head imaging.  I explained risk first benefits of obtaining vs declining head CT, including but not limited to missed skull fracture, missed bleed, death.  At this time, both patient and son wish to decline CT head at this time.  Given patient's reassuring exam, I feel that this is reasonable. Discussed patient with Dr. Patria Mane who is agreeable to plan.   Reevaluation.  Patient reports improvement in headache after analgesics.  She is able to ambulate in the department without any difficulty.  Blood pressure is slightly improved, the patient has not taken her medications this afternoon.  At this time, patient is stable for discharge.  I discussed with both patient and her son regarding closed head injury precautions.  Patient instructed to follow-up with her primary care doctor in the next 24-48 hours for further evaluation. Patient had ample opportunity for questions and discussion. All patient's questions were answered with full understanding. Strict return precautions discussed. Patient expresses understanding and agreement to plan.    Final Clinical Impressions(s) / ED Diagnoses   Final diagnoses:  Minor head injury, initial encounter  Abrasion    ED Discharge Orders    None       Rosana Hoes 11/30/17 1550    Azalia Bilis, MD 11/30/17 [phone]

## 2017-11-30 NOTE — ED Triage Notes (Signed)
[place] [facility] [place] Patient was walking down stairs, tripped, fail and hit her left temple. Witnessed fall. Patient went head first on concrete. No LOC, no neck or back pain. No blood thinners. No nausea, no dizziness. Patient c/o headache. Hx. Of hypertension.

## 2017-11-30 NOTE — Discharge Instructions (Signed)
As we discussed, closely monitor for any signs vomiting, vision changes, severe headache that does not improve, difficulty walking, numbness/weakness of your arms or legs or any other worsening or concerning symptoms.  If you experience any of the symptoms, return to the emergency department.  Follow-up with your primary care doctor in the next 24-48 hours.

## 2017-11-30 NOTE — ED Notes (Signed)
[facility] Expected date:  Expected time:  Means of arrival:  Comments: 84 yo fall

## 2018-04-02 ENCOUNTER — Other Ambulatory Visit: Payer: Self-pay | Admitting: Orthopedic Surgery

## 2018-04-02 DIAGNOSIS — M545 Low back pain: Principal | ICD-10-CM

## 2018-04-02 DIAGNOSIS — G8929 Other chronic pain: Secondary | ICD-10-CM

## 2018-04-17 ENCOUNTER — Other Ambulatory Visit: Payer: Medicare Other

## 2018-04-17 ENCOUNTER — Inpatient Hospital Stay
Admission: RE | Admit: 2018-04-17 | Discharge: 2018-04-17 | Disposition: A | Discharge status: Home / Self Care | Payer: Medicare Other | Source: Ambulatory Visit | Attending: Orthopedic Surgery | Admitting: Orthopedic Surgery

## 2018-04-17 NOTE — Discharge Instructions (Signed)

## 2020-01-20 ENCOUNTER — Other Ambulatory Visit (HOSPITAL_COMMUNITY): Payer: Self-pay | Admitting: Physician Assistant

## 2020-01-20 ENCOUNTER — Other Ambulatory Visit: Payer: Self-pay | Admitting: Physician Assistant

## 2020-01-20 DIAGNOSIS — Z96651 Presence of right artificial knee joint: Secondary | ICD-10-CM

## 2020-01-20 DIAGNOSIS — M25561 Pain in right knee: Secondary | ICD-10-CM | POA: Diagnosis not present

## 2020-01-28 ENCOUNTER — Encounter (HOSPITAL_COMMUNITY)
Admission: RE | Admit: 2020-01-28 | Discharge: 2020-01-28 | Disposition: A | Discharge status: Home / Self Care | Payer: Medicare Other | Source: Ambulatory Visit | Attending: Physician Assistant | Admitting: Physician Assistant

## 2020-01-28 ENCOUNTER — Other Ambulatory Visit: Payer: Self-pay

## 2020-01-28 DIAGNOSIS — Z96651 Presence of right artificial knee joint: Secondary | ICD-10-CM | POA: Diagnosis not present

## 2020-01-28 DIAGNOSIS — S8991XA Unspecified injury of right lower leg, initial encounter: Secondary | ICD-10-CM | POA: Diagnosis not present

## 2020-01-28 DIAGNOSIS — M25561 Pain in right knee: Secondary | ICD-10-CM | POA: Diagnosis not present

## 2020-01-28 DIAGNOSIS — M7989 Other specified soft tissue disorders: Secondary | ICD-10-CM | POA: Diagnosis not present

## 2020-01-28 MED ORDER — TECHNETIUM TC 99M MEDRONATE IV KIT
20.7000 | PACK | Freq: Once | INTRAVENOUS | Status: AC | PRN
  Administered 2020-01-28: 20.7 via INTRAVENOUS

## 2020-03-25 ENCOUNTER — Other Ambulatory Visit: Payer: Self-pay | Admitting: Specialist

## 2020-03-25 DIAGNOSIS — M25561 Pain in right knee: Secondary | ICD-10-CM

## 2020-03-31 DIAGNOSIS — E559 Vitamin D deficiency, unspecified: Secondary | ICD-10-CM | POA: Diagnosis not present

## 2020-03-31 DIAGNOSIS — I10 Essential (primary) hypertension: Secondary | ICD-10-CM | POA: Diagnosis not present

## 2020-04-07 DIAGNOSIS — E559 Vitamin D deficiency, unspecified: Secondary | ICD-10-CM | POA: Diagnosis not present

## 2020-04-07 DIAGNOSIS — Z780 Asymptomatic menopausal state: Secondary | ICD-10-CM | POA: Diagnosis not present

## 2020-04-07 DIAGNOSIS — I10 Essential (primary) hypertension: Secondary | ICD-10-CM | POA: Diagnosis not present

## 2020-04-07 DIAGNOSIS — K219 Gastro-esophageal reflux disease without esophagitis: Secondary | ICD-10-CM | POA: Diagnosis not present

## 2020-08-24 ENCOUNTER — Other Ambulatory Visit: Payer: Self-pay | Admitting: Gerontology

## 2020-08-24 DIAGNOSIS — Z13820 Encounter for screening for osteoporosis: Secondary | ICD-10-CM

## 2020-12-06 ENCOUNTER — Ambulatory Visit
Admission: RE | Admit: 2020-12-06 | Discharge: 2020-12-06 | Disposition: A | Discharge status: Home / Self Care | Payer: Medicare Other | Source: Ambulatory Visit | Attending: Gerontology | Admitting: Gerontology

## 2020-12-06 ENCOUNTER — Other Ambulatory Visit: Payer: Self-pay

## 2020-12-06 DIAGNOSIS — Z13820 Encounter for screening for osteoporosis: Secondary | ICD-10-CM

## 2021-10-01 ENCOUNTER — Ambulatory Visit (HOSPITAL_COMMUNITY)
Admission: EM | Admit: 2021-10-01 | Discharge: 2021-10-01 | Disposition: A | Discharge status: Home / Self Care | Payer: Medicare (Managed Care) | Attending: Physician Assistant | Admitting: Physician Assistant

## 2021-10-01 ENCOUNTER — Encounter (HOSPITAL_COMMUNITY): Payer: Self-pay

## 2021-10-01 ENCOUNTER — Other Ambulatory Visit: Payer: Self-pay

## 2021-10-01 ENCOUNTER — Ambulatory Visit (INDEPENDENT_AMBULATORY_CARE_PROVIDER_SITE_OTHER): Payer: Medicare (Managed Care)

## 2021-10-01 DIAGNOSIS — R059 Cough, unspecified: Secondary | ICD-10-CM

## 2021-10-01 DIAGNOSIS — J40 Bronchitis, not specified as acute or chronic: Secondary | ICD-10-CM | POA: Diagnosis not present

## 2021-10-01 DIAGNOSIS — R051 Acute cough: Secondary | ICD-10-CM

## 2021-10-01 DIAGNOSIS — J329 Chronic sinusitis, unspecified: Secondary | ICD-10-CM | POA: Diagnosis not present

## 2021-10-01 MED ORDER — DOXYCYCLINE HYCLATE 100 MG PO CAPS: 100.0000 mg | ORAL_CAPSULE | Freq: Two times a day (BID) | ORAL | 0 refills | Status: AC

## 2021-10-01 NOTE — Discharge Instructions (Addendum)
Your chest x-ray showed some evidence of fluid but did not show any evidence of infection.  We are going to treat you with doxycycline.  Take this twice a day for 10 days.  This can upset your stomach so take it with food.  In order to address the fluid I recommend that you increase your Lasix tomorrow and Monday and then follow-up with your primary care provider.  If you develop any worsening symptoms including shortness of breath, chest pain, fever, nausea, vomiting you need to go to the emergency room.

## 2021-10-01 NOTE — ED Provider Notes (Signed)
[facility]    CSN: [id number] Arrival date & time: 10/01/21  1123      History   Chief Complaint Chief Complaint  Patient presents with   Fever   Generalized Body Aches    HPI Loretta Hartman is a 87 y.o. female.   Patient presents today with a 6-day history of URI symptoms.  She was seen by a local clinic and tested negative for COVID and flu.  She has been using over-the-counter medication without improvement of symptoms.  Over the past several days she has developed worsening symptoms including low-grade fever with highest recorded temperature 100.8 F and was cough.  Denies any shortness of breath, chest pain, nausea, vomiting.  Does report significant nasal congestion and sinus pressure.  Denies any recent antibiotic use.  She does have a history of recurrent bronchitis but denies formal diagnosis of asthma or COPD.  She is a former smoker but quit many years ago.  She is up-to-date on influenza, pneumonia, COVID vaccinations.  Denies history of diabetes or immunosuppression.   Past Medical History:  Diagnosis Date   Arthritis    Bronchitis    Hyperlipidemia    Hypertension     Patient Active Problem List   Diagnosis Date Noted   Rectal bleeding 09/29/2014   Acute blood loss anemia 09/22/2014   Lower GI bleed    Acute on chronic renal failure (HCC) 06/10/2014   Rash and nonspecific skin eruption 06/10/2014   Cellulitis of left lower extremity-failed outpatient management 06/08/2014   Hypertension goal BP (blood pressure) < 130/80 06/08/2014   Metabolic acidosis, increased anion gap (IAG) 06/08/2014   Chronic kidney disease (CKD), stage III (moderate) (HCC) 06/08/2014   Hyperlipidemia 06/08/2014    Past Surgical History:  Procedure Laterality Date   CHOLECYSTECTOMY     COLONOSCOPY WITH PROPOFOL N/A 09/23/2014   Procedure: COLONOSCOPY WITH PROPOFOL;  Surgeon: Theda Belfast, MD;  Location: [facility];  Service: Endoscopy;  Laterality: N/A;   TOTAL  KNEE ARTHROPLASTY     bilateral    OB History   No obstetric history on file.      Home Medications    Prior to Admission medications   Medication Sig Start Date End Date Taking? Authorizing Provider  doxycycline (VIBRAMYCIN) 100 MG capsule Take 1 capsule (100 mg total) by mouth 2 (two) times daily. 10/01/21  Yes Suheyla Mortellaro, Noberto Retort, PA-C  acetaminophen (TYLENOL) 325 MG tablet Take 2 tablets (650 mg total) by mouth every 6 (six) hours as needed for mild pain (or Fever >/= 101). 10/03/14   Rama, Maryruth Bun, MD  amLODipine (NORVASC) 5 MG tablet Take 5 mg by mouth daily. 06/27/16   [provider]  fluorometholone (FML) 0.1 % ophthalmic suspension Place 1 drop into both eyes daily as needed (irritation).  07/13/14   [provider]  furosemide (LASIX) 40 MG tablet Take 40 mg by mouth daily.  09/02/14   [provider]  Multiple Vitamin (MULTIVITAMIN WITH MINERALS) TABS tablet Take 1 tablet by mouth daily.    [provider]  Omega-3 Fatty Acids (OMEGA 3 PO) Take 1 capsule by mouth every other day.     [provider]  pantoprazole (PROTONIX) 40 MG tablet Take 1 tablet (40 mg total) by mouth daily. Patient not taking: Reported on 04/02/2018 09/24/14   Richarda Overlie, MD  polyethylene glycol (MIRALAX / GLYCOLAX) packet Take 17 g by mouth daily. Patient not taking: Reported on 04/02/2018 10/03/14   Rama,  Venetia Maxon, MD  predniSONE (DELTASONE) 20 MG tablet 2 tabs po daily x 4 days 07/03/16   Deno Etienne, DO  rosuvastatin (CRESTOR) 10 MG tablet Take 10 mg by mouth at bedtime.     [provider]    Family History Family History  Problem Relation Age of Onset   Cancer Father    Cancer Brother    Diabetes Brother     Social History Social History   Tobacco Use   Smoking status: Former    Years: 25.00    Types: Cigarettes    Quit date: 06/08/1990    Years since quitting: 31.3   Smokeless tobacco: Current    Types: Snuff  Substance Use Topics    Alcohol use: No   Drug use: No     Allergies   Penicillins, Lisinopril, Other, and Clindamycin/lincomycin   Review of Systems Review of Systems  Constitutional:  Positive for activity change. Negative for appetite change, fatigue and fever.  HENT:  Positive for congestion and sinus pressure. Negative for sneezing and sore throat.   Respiratory:  Positive for cough. Negative for shortness of breath.   Cardiovascular:  Negative for chest pain.  Gastrointestinal:  Negative for abdominal pain, diarrhea, nausea and vomiting.  Musculoskeletal:  Negative for arthralgias and myalgias.  Neurological:  Negative for dizziness, light-headedness and headaches.    Physical Exam Triage Vital Signs ED Triage Vitals  Enc Vitals Group     BP 10/01/21 1241 (!) 154/75     Pulse Rate 10/01/21 1241 60     Resp 10/01/21 1241 19     Temp 10/01/21 1241 98.8 F (37.1 C)     Temp Source 10/01/21 1241 Oral     SpO2 10/01/21 1241 98 %     Weight --      Height --      Head Circumference --      Peak Flow --      Pain Score 10/01/21 1240 6     Pain Loc --      Pain Edu? --      Excl. in [place]? --    No data found.  Updated Vital Signs BP (!) 154/75 (BP Location: Left Arm)   Pulse 60   Temp 98.8 F (37.1 C) (Oral)   Resp 19   SpO2 98%   Visual Acuity Right Eye Distance:   Left Eye Distance:   Bilateral Distance:    Right Eye Near:   Left Eye Near:    Bilateral Near:     Physical Exam Vitals reviewed.  Constitutional:      General: She is awake. She is not in acute distress.    Appearance: Normal appearance. She is well-developed. She is not ill-appearing.     Comments: Very pleasant female appears stated age in no acute distress  HENT:     Head: Normocephalic and atraumatic.     Right Ear: Tympanic membrane, ear canal and external ear normal. Tympanic membrane is not erythematous or bulging.     Left Ear: Tympanic membrane, ear canal and external ear normal. Tympanic membrane is not  erythematous or bulging.     Nose:     Right Sinus: Maxillary sinus tenderness and frontal sinus tenderness present.     Left Sinus: Maxillary sinus tenderness and frontal sinus tenderness present.     Mouth/Throat:     Pharynx: Uvula midline. No oropharyngeal exudate or posterior oropharyngeal erythema.  Cardiovascular:     Rate and Rhythm:  Normal rate and regular rhythm.     Heart sounds: Normal heart sounds, S1 normal and S2 normal. No murmur heard. Pulmonary:     Effort: Pulmonary effort is normal.     Breath sounds: Normal breath sounds. No wheezing, rhonchi or rales.     Comments: Clear to auscultation bilaterally Psychiatric:        Behavior: Behavior is cooperative.     UC Treatments / Results  Labs (all labs ordered are listed, but only abnormal results are displayed) Labs Reviewed - No data to display  EKG   Radiology DG Chest 2 View  Result Date: 10/01/2021 CLINICAL DATA:  Worsening cough. EXAM: CHEST - 2 VIEW COMPARISON:  None. FINDINGS: Normal cardiac silhouette. Chronic elevation LEFT hemidiaphragm. Fine linear markings in the lungs. No effusion, infiltrate or pneumothorax. IMPRESSION: Fine linear markings suggest interstitial edema. No focal consolidation. Electronically Signed   By: Suzy Bouchard M.D.   On: 10/01/2021 13:57    Procedures Procedures (including critical care time)  Medications Ordered in UC Medications - No data to display  Initial Impression / Assessment and Plan / UC Course  I have reviewed the triage vital signs and the nursing notes.  Pertinent labs & imaging results that were available during my care of the patient were reviewed by me and considered in my medical decision making (see chart for details).     No indication for repeat viral testing given patient has been symptomatic for a week and this would not change management.  X-ray was obtained that showed interstitial edema without consolidation.  Recommended patient take 1.5  tablets of Lasix for the next 2 days and follow-up with PCP first thing next week for reevaluation.  Given worsening congestion symptoms will cover for sinobronchitis due to recent illness and she was prescribed doxycycline twice daily.  Recommend she take this with food.  She is to rest and drink plenty of fluid.  Discussed that if she has any worsening symptoms over the weekend she needs to go to the emergency room and discussed alarm symptoms that warrant emergent evaluation.  Strict return precautions given to which she and daughter expressed understanding.  Final Clinical Impressions(s) / UC Diagnoses   Final diagnoses:  Sinobronchitis  Acute cough     Discharge Instructions      Your chest x-ray showed some evidence of fluid but did not show any evidence of infection.  We are going to treat you with doxycycline.  Take this twice a day for 10 days.  This can upset your stomach so take it with food.  In order to address the fluid I recommend that you increase your Lasix tomorrow and Monday and then follow-up with your primary care provider.  If you develop any worsening symptoms including shortness of breath, chest pain, fever, nausea, vomiting you need to go to the emergency room.     ED Prescriptions     Medication Sig Dispense Auth. Provider   doxycycline (VIBRAMYCIN) 100 MG capsule Take 1 capsule (100 mg total) by mouth 2 (two) times daily. 20 capsule Svara Twyman, Derry Skill, PA-C      PDMP not reviewed this encounter.   Terrilee Croak, PA-C 10/01/21 1412

## 2021-10-01 NOTE — ED Triage Notes (Signed)
Pt presents with body aches and fever 5 days.   Pt states she went to the clinic at [facility] and states she was tested for COVID and FLU. States she was told she had a bad cold.   States she started feeling congested and was coughing up mucus. Pt states this morning she blew her nose and states she noticed bloody mucus and c/o a headache.

## 2022-01-05 IMAGING — DX DG CHEST 2V
2 series · 2 of 2 positions shown · non-contrast
Comparison: None.

CLINICAL DATA: Worsening cough.

EXAM:
CHEST - 2 VIEW

[chest pa]
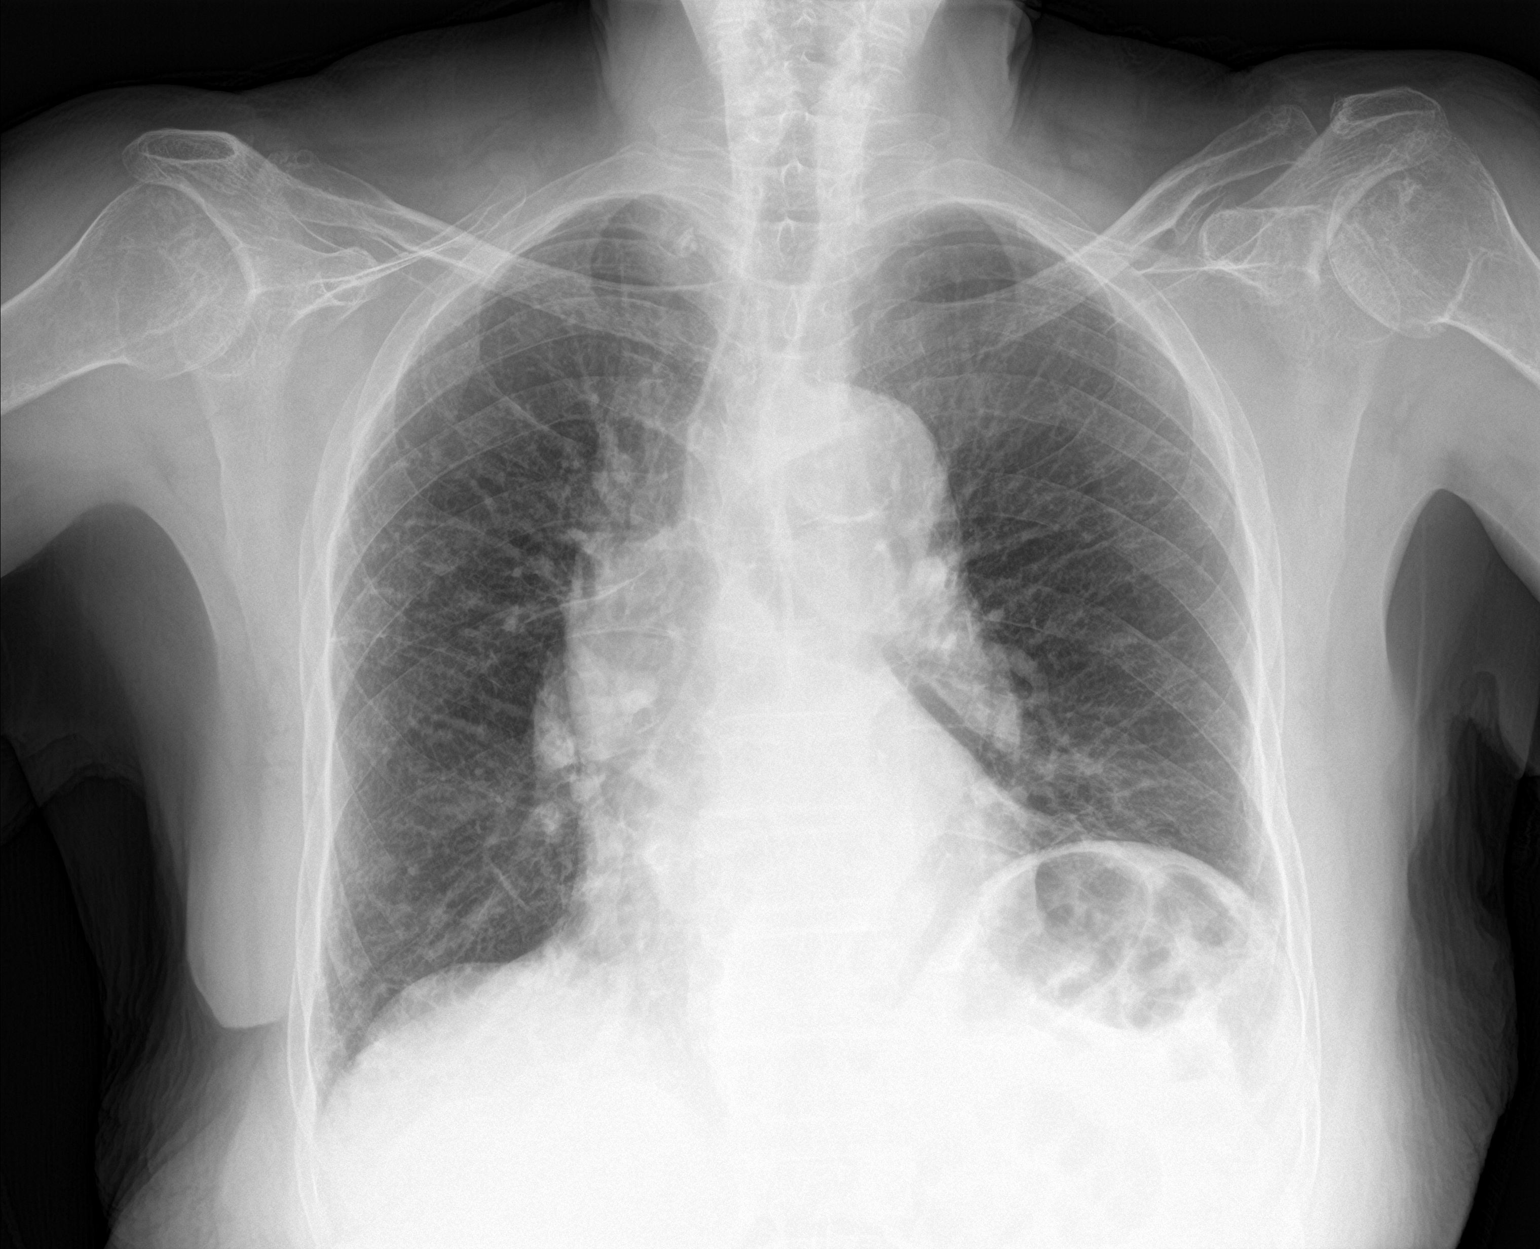

[chest lat]
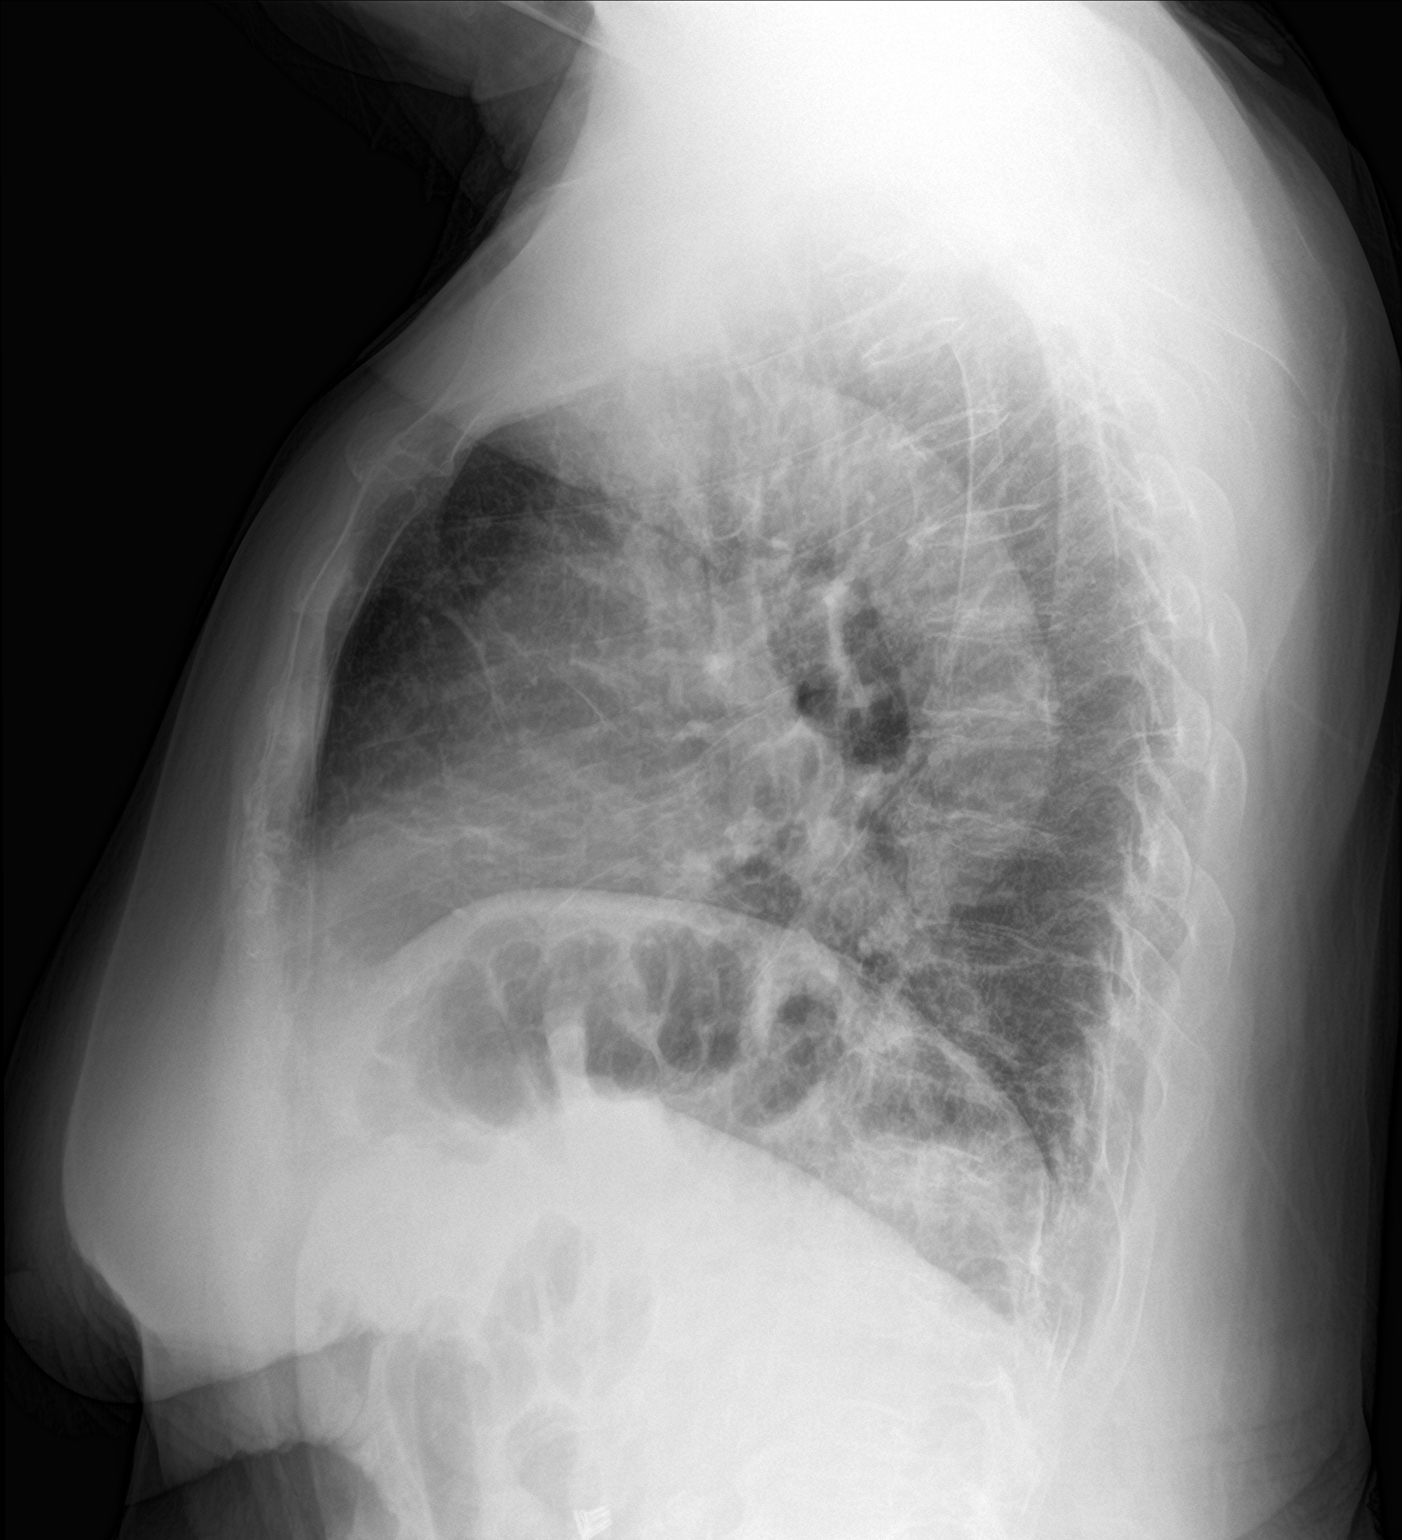

[2 of 2 positions shown; findings below may reference images not displayed]

FINDINGS: Normal cardiac silhouette. Chronic elevation LEFT hemidiaphragm.
Fine linear markings in the lungs. No effusion, infiltrate or
pneumothorax.
IMPRESSION: Fine linear markings suggest interstitial edema. No focal
consolidation.

## 2022-11-17 ENCOUNTER — Ambulatory Visit (INDEPENDENT_AMBULATORY_CARE_PROVIDER_SITE_OTHER): Payer: Medicare (Managed Care)

## 2022-11-17 ENCOUNTER — Encounter (HOSPITAL_COMMUNITY): Payer: Self-pay | Admitting: Emergency Medicine

## 2022-11-17 ENCOUNTER — Ambulatory Visit (HOSPITAL_COMMUNITY)
Admission: EM | Admit: 2022-11-17 | Discharge: 2022-11-17 | Disposition: A | Discharge status: Home / Self Care | Payer: Medicare (Managed Care)

## 2022-11-17 DIAGNOSIS — R051 Acute cough: Secondary | ICD-10-CM

## 2022-11-17 DIAGNOSIS — J209 Acute bronchitis, unspecified: Secondary | ICD-10-CM | POA: Diagnosis not present

## 2022-11-17 DIAGNOSIS — R0602 Shortness of breath: Secondary | ICD-10-CM

## 2022-11-17 MED ORDER — AEROCHAMBER PLUS FLO-VU LARGE MISC
Status: AC
  Filled 2022-11-17: qty 1

## 2022-11-17 MED ORDER — ALBUTEROL SULFATE HFA 108 (90 BASE) MCG/ACT IN AERS
INHALATION_SPRAY | RESPIRATORY_TRACT | Status: AC
  Filled 2022-11-17: qty 6.7

## 2022-11-17 MED ORDER — METHYLPREDNISOLONE SODIUM SUCC 125 MG IJ SOLR
INTRAMUSCULAR | Status: AC
  Filled 2022-11-17: qty 2

## 2022-11-17 MED ORDER — ALBUTEROL SULFATE HFA 108 (90 BASE) MCG/ACT IN AERS
1.0000 | INHALATION_SPRAY | Freq: Once | RESPIRATORY_TRACT | Status: AC
  Administered 2022-11-17: 2 via RESPIRATORY_TRACT

## 2022-11-17 MED ORDER — AZITHROMYCIN 250 MG PO TABS: 250.0000 mg | ORAL_TABLET | Freq: Every day | ORAL | 0 refills | Status: AC

## 2022-11-17 MED ORDER — METHYLPREDNISOLONE SODIUM SUCC 125 MG IJ SOLR
60.0000 mg | Freq: Once | INTRAMUSCULAR | Status: AC
  Administered 2022-11-17: 60 mg via INTRAMUSCULAR

## 2022-11-17 MED ORDER — AEROCHAMBER PLUS FLO-VU MEDIUM MISC
1.0000 | Freq: Once | Status: AC
  Administered 2022-11-17: 1

## 2022-11-17 NOTE — ED Triage Notes (Signed)
Productive cough and fatigue x 1 week. O2 after walking down hall was 84% in triage. After resting has come up to 91% on RA. Taking some form of "tussin" medicine OTC, and feels as though she's unable to cough the mucus up. Denies fever, sore throat, generalized body aches

## 2022-11-17 NOTE — Discharge Instructions (Addendum)
You have bronchitis with is inflammation of the upper airway of the lungs.   Your chest x-ray is normal and negative for pneumonia.  I gave you a shot of steroid in the clinic to help calm down inflammation in your lungs contributing to your cough.   You may use the albuterol inhaler with the spacer every 6 hours as needed/as directed for cough and wheezing/shortness of breath.  Take azithromycin antibiotic as directed.   [facility] to ensure that your symptoms have improved.   If you develop any new or worsening symptoms or do not improve in the next 2 to 3 days, please return.  If your symptoms are severe, please go to the emergency room.  Follow-up with your primary care provider for further evaluation and management of your symptoms as well as ongoing wellness visits.  I hope you feel better!

## 2022-11-17 NOTE — ED Provider Notes (Signed)
[place]    CSN: [id number] Arrival date & time: 11/17/22  1029      History   Chief Complaint Chief Complaint  Patient presents with   Cough    HPI Loretta Hartman is a 89 y.o. female.   Patient with history of HTN, CKD stage 3, bronchitis, and arthritis presents to urgent care with her grandson who contributes to the history for evaluation of productive cough, generalized weakness, and generalized fatigue for the last 1 week. Reports post-tussive shortness of breath and chest discomfort that resolves shortly after coughing. She has heard wheezing to her chest associated with shortness of breath as well. Reports chills without documented fever. She is tolerating food and fluids well without nausea, vomiting, abdominal pain, body aches, blood/mucous to the stool, or heart palpitations. No recent steroid or antibiotic use. She is a former smoker with history of frequent bronchitis. Quit smoking 30 years ago, denies other drug use. Denies orthopnea. She has leg swelling at baseline and wears compression socks, states swelling to her bilateral lower extremities has not gotten worse since becoming sick. She keeps her legs elevated to alleviate swelling and denies calf pain. No one-sided weakness, dizziness, vision changes, nasal congestion, sore throat, or decreased oral intake. She has been using over the counter cough medicine to help with symptoms without relief.    Cough   Past Medical History:  Diagnosis Date   Arthritis    Bronchitis    Hyperlipidemia    Hypertension     Patient Active Problem List   Diagnosis Date Noted   Rectal bleeding 09/29/2014   Acute blood loss anemia 09/22/2014   Lower GI bleed    Acute on chronic renal failure ([place]) 06/10/2014   Rash and nonspecific skin eruption 06/10/2014   Cellulitis of left lower extremity-failed outpatient management 06/08/2014   Hypertension goal BP (blood pressure) < 130/80 78/46/9629   Metabolic acidosis,  increased anion gap (IAG) 06/08/2014   Chronic kidney disease (CKD), stage III (moderate) ([place]) 06/08/2014   Hyperlipidemia 06/08/2014    Past Surgical History:  Procedure Laterality Date   CHOLECYSTECTOMY     COLONOSCOPY WITH PROPOFOL N/A 09/23/2014   Procedure: COLONOSCOPY WITH PROPOFOL;  Surgeon: Beryle Beams, MD;  Location: [facility];  Service: Endoscopy;  Laterality: N/A;   TOTAL KNEE ARTHROPLASTY     bilateral    OB History   No obstetric history on file.      Home Medications    Prior to Admission medications   Medication Sig Start Date End Date Taking? Authorizing Provider  azithromycin (ZITHROMAX) 250 MG tablet Take 1 tablet (250 mg total) by mouth daily. Take first 2 tablets together, then 1 every day until finished. 11/17/22  Yes Talbot Grumbling, FNP  cholecalciferol (VITAMIN D3) 25 MCG (1000 UNIT) tablet Take 1,000 Units by mouth daily.   Yes [provider]  cinacalcet (SENSIPAR) 30 MG tablet Take 30 mg by mouth daily.   Yes [provider]  furosemide (LASIX) 40 MG tablet Take 40 mg by mouth daily.  09/02/14  Yes [provider]  metoprolol tartrate (LOPRESSOR) 50 MG tablet Take 50 mg by mouth 2 (two) times daily.   Yes [provider]  rosuvastatin (CRESTOR) 10 MG tablet Take 10 mg by mouth at bedtime.    Yes [provider]  acetaminophen (TYLENOL) 325 MG tablet Take 2 tablets (650 mg total) by mouth every 6 (six) hours as needed for mild pain (or Fever >/=  101). 10/03/14   Rama, Venetia Maxon, MD  amLODipine (NORVASC) 5 MG tablet Take 5 mg by mouth daily. 06/27/16   [provider]  doxycycline (VIBRAMYCIN) 100 MG capsule Take 1 capsule (100 mg total) by mouth 2 (two) times daily. 10/01/21   Raspet, Derry Skill, PA-C  fluorometholone (FML) 0.1 % ophthalmic suspension Place 1 drop into both eyes daily as needed (irritation).  07/13/14   [provider]  Multiple Vitamin (MULTIVITAMIN WITH MINERALS) TABS  tablet Take 1 tablet by mouth daily.    [provider]  Omega-3 Fatty Acids (OMEGA 3 PO) Take 1 capsule by mouth every other day.     [provider]  pantoprazole (PROTONIX) 40 MG tablet Take 1 tablet (40 mg total) by mouth daily. Patient not taking: Reported on 04/02/2018 09/24/14   Reyne Dumas, MD  polyethylene glycol (MIRALAX / GLYCOLAX) packet Take 17 g by mouth daily. Patient not taking: Reported on 04/02/2018 10/03/14   Rama, Venetia Maxon, MD  predniSONE (DELTASONE) 20 MG tablet 2 tabs po daily x 4 days 07/03/16   Deno Etienne, DO    Family History Family History  Problem Relation Age of Onset   Cancer Father    Cancer Brother    Diabetes Brother     Social History Social History   Tobacco Use   Smoking status: Former    Years: 25.00    Types: Cigarettes    Quit date: 06/08/1990    Years since quitting: 32.4   Smokeless tobacco: Current    Types: Snuff  Substance Use Topics   Alcohol use: No   Drug use: No     Allergies   Penicillins, Lisinopril, Other, and Clindamycin/lincomycin   Review of Systems Review of Systems  Respiratory:  Positive for cough.   Per HPI  Physical Exam Triage Vital Signs ED Triage Vitals  Enc Vitals Group     BP 11/17/22 1206 (!) 176/78     Pulse Rate 11/17/22 1206 61     Resp 11/17/22 1206 16     Temp 11/17/22 1206 98 F (36.7 C)     Temp Source 11/17/22 1206 Oral     SpO2 11/17/22 1206 91 %     Weight --      Height --      Head Circumference --      Peak Flow --      Pain Score 11/17/22 1207 0     Pain Loc --      Pain Edu? --      Excl. in [place]? --    No data found.  Updated Vital Signs BP (!) 176/78 (BP Location: Right Arm)   Pulse 61   Temp 98 F (36.7 C) (Oral)   Resp 16   SpO2 91%   Visual Acuity Right Eye Distance:   Left Eye Distance:   Bilateral Distance:    Right Eye Near:   Left Eye Near:    Bilateral Near:     Physical Exam Vitals and nursing note reviewed.  Constitutional:       Appearance: She is not ill-appearing or toxic-appearing.  HENT:     Head: Normocephalic and atraumatic.     Right Ear: Hearing, tympanic membrane, ear canal and external ear normal.     Left Ear: Hearing, tympanic membrane, ear canal and external ear normal.     Nose: Rhinorrhea present.     Mouth/Throat:     Lips: Pink.     Mouth:  Mucous membranes are moist.     Pharynx: Posterior oropharyngeal erythema present.     Comments: Small amount of clear postnasal drainage visualized to the posterior oropharynx. Mild erythema to the posterior oropharynx.  Eyes:     General: Lids are normal. Vision grossly intact. Gaze aligned appropriately.     Extraocular Movements: Extraocular movements intact.     Conjunctiva/sclera: Conjunctivae normal.  Cardiovascular:     Rate and Rhythm: Normal rate and regular rhythm.     Heart sounds: Normal heart sounds, S1 normal and S2 normal.  Pulmonary:     Effort: Pulmonary effort is normal. No respiratory distress.     Breath sounds: Normal air entry. No stridor. Wheezing present. No rhonchi or rales.     Comments: Faint expiratory wheeze and course breath sounds heard to the bilateral lower lung fields without any other adventitious sounds heard to auscultation. No respiratory distress.  Chest:     Chest wall: No tenderness.  Abdominal:     General: Bowel sounds are normal.     Palpations: Abdomen is soft.     Tenderness: There is no abdominal tenderness. There is no right CVA tenderness, left CVA tenderness or guarding.  Musculoskeletal:     Cervical back: Neck supple.     Right lower leg: No edema.     Left lower leg: No edema.  Lymphadenopathy:     Cervical: No cervical adenopathy.  Skin:    General: Skin is warm and dry.     Capillary Refill: Capillary refill takes less than 2 seconds.     Findings: No rash.  Neurological:     General: No focal deficit present.     Mental Status: She is alert and oriented to person, place, and time. Mental  status is at baseline.     Cranial Nerves: No dysarthria or facial asymmetry.  Psychiatric:        Mood and Affect: Mood normal.        Speech: Speech normal.        Behavior: Behavior normal.        Thought Content: Thought content normal.        Judgment: Judgment normal.      UC Treatments / Results  Labs (all labs ordered are listed, but only abnormal results are displayed) Labs Reviewed - No data to display  EKG   Radiology DG Chest 2 View  Result Date: 11/17/2022 CLINICAL DATA:  Shortness of breath, fatigue for 1 week EXAM: CHEST - 2 VIEW COMPARISON:  10/01/2021 FINDINGS: Mild bilateral chronic interstitial thickening. Chronic elevation of the left diaphragm. No focal consolidation. No pleural effusion or pneumothorax. Heart and mediastinal contours are unremarkable. No acute osseous abnormality. IMPRESSION: No active cardiopulmonary disease. Electronically Signed   By: Elige Ko M.D.   On: 11/17/2022 12:48    Procedures Procedures (including critical care time)  Medications Ordered in UC Medications  methylPREDNISolone sodium succinate (SOLU-MEDROL) 125 mg/2 mL injection 60 mg (60 mg Intramuscular Given 11/17/22 1326)  albuterol (VENTOLIN HFA) 108 (90 Base) MCG/ACT inhaler 1-2 puff (2 puffs Inhalation Given 11/17/22 1328)  AeroChamber Plus Flo-Vu Medium MISC 1 each (1 each Other Given 11/17/22 1328)    Initial Impression / Assessment and Plan / UC Course  I have reviewed the triage vital signs and the nursing notes.  Pertinent labs & imaging results that were available during my care of the patient were reviewed by me and considered in my medical decision making (see chart for  details).   1. Acute bronchitis, acute cough Presentation is consistent with acute viral bronchitis that will likely improve with use of steroid and cough medications along with rest and increase fluid intake over the next few days. I would like to manage this with azithromycin antibiotic as well  similar to COPD exacerbation due to patient's smoking history and comorbidities. Chest x-ray performed due to persistent cough and wheeze is negative for active cardiopulmonary disease/abnormality. Patient is nontoxic in appearance with hemodynamically stable vital signs. Oxygen initially 84-85% on room air after walking to exam room in urgent care. After sitting at rest for a few minutes, oxygen saturation increased to 96% on room air.   Given albuterol inhaler 2 puffs with spacer and 60mg  solumedrol IM injection in clinic for wheeze and shortness of breath. She may take tylenol 1,000mg  every 6 hours as needed for aches/pains. May continue taking over the counter medications as needed for cough. May use albuterol inhaler every 6 hours as needed for cough, wheeze, and shortness of breath at home. PCP follow-up recommended should symptoms fail to improve in the next 3-4 days.  Discussed physical exam and available lab work findings in clinic with patient.  Counseled patient regarding appropriate use of medications and potential side effects for all medications recommended or prescribed today. Discussed red flag signs and symptoms of worsening condition,when to call the PCP office, return to urgent care, and when to seek higher level of care in the emergency department. Patient verbalizes understanding and agreement with plan. All questions answered. Patient discharged in stable condition.     Final Clinical Impressions(s) / UC Diagnoses   Final diagnoses:  Acute bronchitis, unspecified organism  Acute cough     Discharge Instructions      You have bronchitis with is inflammation of the upper airway of the lungs.   Your chest x-ray is normal and negative for pneumonia.  I gave you a shot of steroid in the clinic to help calm down inflammation in your lungs contributing to your cough.   You may use the albuterol inhaler with the spacer every 6 hours as needed/as directed for cough and  wheezing/shortness of breath.  Take azithromycin antibiotic as directed.   [facility]up [facility] to ensure that your symptoms have improved.   If you develop any new or worsening symptoms or do not improve in the next 2 to 3 days, please return.  If your symptoms are severe, please go to the emergency room.  Follow-up with your primary care provider for further evaluation and management of your symptoms as well as ongoing wellness visits.  I hope you feel better!   ED Prescriptions     Medication Sig Dispense Auth. Provider   azithromycin (ZITHROMAX) 250 MG tablet Take 1 tablet (250 mg total) by mouth daily. Take first 2 tablets together, then 1 every day until finished. 6 tablet , FNP      PDMP not reviewed this encounter.   Carlisle Beers, Carlisle Beers 11/20/22 2132

## 2023-02-27 ENCOUNTER — Other Ambulatory Visit: Payer: Self-pay | Admitting: Internal Medicine

## 2023-02-27 DIAGNOSIS — N939 Abnormal uterine and vaginal bleeding, unspecified: Secondary | ICD-10-CM

## 2023-03-29 ENCOUNTER — Ambulatory Visit
Admission: RE | Admit: 2023-03-29 | Discharge: 2023-03-29 | Disposition: A | Discharge status: Home / Self Care | Payer: Medicare (Managed Care) | Source: Ambulatory Visit | Attending: Internal Medicine | Admitting: Internal Medicine

## 2023-03-29 DIAGNOSIS — N939 Abnormal uterine and vaginal bleeding, unspecified: Secondary | ICD-10-CM

## 2023-04-11 ENCOUNTER — Encounter: Payer: Self-pay | Admitting: Family Medicine

## 2023-04-11 ENCOUNTER — Other Ambulatory Visit (HOSPITAL_COMMUNITY)
Admission: RE | Admit: 2023-04-11 | Discharge: 2023-04-11 | Disposition: A | Discharge status: Home / Self Care | Payer: Medicare (Managed Care) | Source: Ambulatory Visit | Attending: Family Medicine | Admitting: Family Medicine

## 2023-04-11 ENCOUNTER — Ambulatory Visit (INDEPENDENT_AMBULATORY_CARE_PROVIDER_SITE_OTHER): Payer: Medicare (Managed Care) | Admitting: Family Medicine

## 2023-04-11 VITALS — BP 146/71 | HR 59 | Ht 61.0 in | Wt 175.4 lb

## 2023-04-11 DIAGNOSIS — N950 Postmenopausal bleeding: Secondary | ICD-10-CM

## 2023-04-11 DIAGNOSIS — R9389 Abnormal findings on diagnostic imaging of other specified body structures: Secondary | ICD-10-CM

## 2023-04-11 NOTE — Progress Notes (Signed)
New Patient is in the office  Pt reports that 2 months ago she had vaginal bleeding that resembled a menstrual cycle, lasting 4-5 days with small clots. She reports 1 month ago she noticed light bleeding when urinating. Pt reports that she also had cramps when the bleeding occurred.

## 2023-04-12 DIAGNOSIS — N950 Postmenopausal bleeding: Secondary | ICD-10-CM | POA: Insufficient documentation

## 2023-04-12 NOTE — Progress Notes (Signed)
    SUBJECTIVE:   CHIEF COMPLAINT / HPI:   AUB Patient is here today for abnormal uterine bleeding.  She saw her primary care provider who obtained an ultrasound showing thickened and heterogeneous endometrium and a defined mass.  Endometrium measuring 1.5 cm in thickness.  Recommended endometrial sampling.  Patient reports that approximately 2 months ago she had vaginal bleeding that was as heavy as a period.  She reports last month she had spotting but no heavy bleeding.  Is not currently bleeding.  No known history of cancer.  Denies any night sweats, unintentional weight loss, abdominal pain.  OBJECTIVE:   BP (!) 146/71   Pulse (!) 59   Ht 5\' 1"  (1.549 m)   Wt 175 lb 6.4 oz (79.6 kg)   BMI 33.14 kg/m   General: Well-appearing 89 year old female in no acute distress Cardiac: Regular rate Respiratory: Normal work of breathing, speaking in full sentences Abdomen: Soft, nontender GU: Normal external vaginal tissue, no bleeding appreciated, no cervical ectopy,  Patient given informed consent, signed copy in the chart, time out was performed.    The patient was placed in the lithotomy position and the cervix brought into view with sterile speculum.  Cervix was clearly visible.  Cervix cleansed x 2 with betadine swabs. A pipelle was introduced to into the uterus, suction created,  and an endometrial sample was obtained.  A second pass was performed.  All equipment was removed and accounted for.  The patient tolerated the procedure well.   Patient given post procedure instructions.  ASSESSMENT/PLAN:   Postmenopausal bleeding Patient presenting for postmenopausal bleeding.  Abnormal pelvic ultrasound.  Endometrial biopsy collected today.  Patient tolerated procedure well.  Will call patient with results when they return to discuss next [place] management.     Celedonio Savage, MD OB Fellow

## 2023-04-12 NOTE — Assessment & Plan Note (Signed)
Patient presenting for postmenopausal bleeding.  Abnormal pelvic ultrasound.  Endometrial biopsy collected today.  Patient tolerated procedure well.  Will call patient with results when they return to discuss next [facility] management.

## 2023-04-16 LAB — SURGICAL PATHOLOGY

## 2023-04-17 ENCOUNTER — Telehealth: Payer: Self-pay

## 2023-04-17 NOTE — Telephone Encounter (Signed)
Dr. Jamelle Rushing from [facility] called to follow up endo bx results and next steps. Please call her to further discuss at [phone].

## 2023-05-09 ENCOUNTER — Ambulatory Visit (INDEPENDENT_AMBULATORY_CARE_PROVIDER_SITE_OTHER): Payer: Medicare (Managed Care) | Admitting: Obstetrics and Gynecology

## 2023-05-09 VITALS — BP 124/73 | HR 54 | Wt 175.0 lb

## 2023-05-09 DIAGNOSIS — Z8742 Personal history of other diseases of the female genital tract: Secondary | ICD-10-CM | POA: Diagnosis not present

## 2023-05-09 NOTE — Progress Notes (Signed)
Pt is in office for results of biopsy. Pt denies pain, cramping or bleeding.

## 2023-05-09 NOTE — Patient Instructions (Signed)
Please call if you have any new vaginal bleeding

## 2023-05-11 NOTE — Progress Notes (Signed)
RETURN GYNECOLOGY VISIT  Subjective:  Loretta Hartman is a 89 y.o. presenting for follow up for postmenopausal bleeding.   Seen by Dr. Nobie Putnam 04/11/23 - 1 episode of bleeding similar to a period in April & 1 episode of spotting in May. Today, reports no further bleeding.   I personally reviewed the following: - Pelvic US 04/03/23: 6.3 x 2.7 x 4.4cm uterus w/ 15mm EL w/ cystic areas. No defined mass. Ovaries non-visualized. I personally reviewed these images and agree with interpretation. - EMB path 04/11/23: Benign endometrium w/ benign endocervical, lower uterine segment, and squamous mucosa. Negative for hyperplasia or malignancy.   Past Medical History:  Diagnosis Date   Arthritis    Bronchitis    Hyperlipidemia    Hypertension    Past Surgical History:  Procedure Laterality Date   CHOLECYSTECTOMY     COLONOSCOPY WITH PROPOFOL N/A 09/23/2014   Procedure: COLONOSCOPY WITH PROPOFOL;  Surgeon: Theda Belfast, MD;  Location: [facility];  Service: Endoscopy;  Laterality: N/A;   TOTAL KNEE ARTHROPLASTY     bilateral   Current Outpatient Medications on File Prior to Visit  Medication Sig Dispense Refill   acetaminophen (TYLENOL) 325 MG tablet Take 2 tablets (650 mg total) by mouth every 6 (six) hours as needed for mild pain (or Fever >/= 101).     amLODipine (NORVASC) 5 MG tablet Take 5 mg by mouth daily.  0   azithromycin (ZITHROMAX) 250 MG tablet Take 1 tablet (250 mg total) by mouth daily. Take first 2 tablets together, then 1 every day until finished. (Patient not taking: Reported on 04/11/2023) 6 tablet 0   cholecalciferol (VITAMIN D3) 25 MCG (1000 UNIT) tablet Take 1,000 Units by mouth daily.     cinacalcet (SENSIPAR) 30 MG tablet Take 30 mg by mouth daily.     doxycycline (VIBRAMYCIN) 100 MG capsule Take 1 capsule (100 mg total) by mouth 2 (two) times daily. 20 capsule 0   fluorometholone (FML) 0.1 % ophthalmic suspension Place 1 drop into both eyes daily as needed  (irritation).   0   furosemide (LASIX) 40 MG tablet Take 40 mg by mouth daily.   0   metoprolol tartrate (LOPRESSOR) 50 MG tablet Take 50 mg by mouth 2 (two) times daily.     Multiple Vitamin (MULTIVITAMIN WITH MINERALS) TABS tablet Take 1 tablet by mouth daily.     Omega-3 Fatty Acids (OMEGA 3 PO) Take 1 capsule by mouth every other day.  (Patient not taking: Reported on 04/11/2023)     pantoprazole (PROTONIX) 40 MG tablet Take 1 tablet (40 mg total) by mouth daily. (Patient not taking: Reported on 04/02/2018) 30 tablet 0   polyethylene glycol (MIRALAX / GLYCOLAX) packet Take 17 g by mouth daily. (Patient not taking: Reported on 04/02/2018) 14 each 0   predniSONE (DELTASONE) 20 MG tablet 2 tabs po daily x 4 days (Patient taking differently: as needed. 2 tabs po daily x 4 days) 8 tablet 0   rosuvastatin (CRESTOR) 10 MG tablet Take 10 mg by mouth at bedtime.      No current facility-administered medications on file prior to visit.   Objective:   Vitals:   05/09/23 1447 05/09/23 1511  BP: (!) 169/71 124/73  Pulse: (!) 54   Weight: 175 lb (79.4 kg)    General:  Alert, oriented and cooperative. Patient is in no acute distress.  Skin: Skin is warm and dry. No rash noted.   Cardiovascular: Normal heart rate noted  Respiratory: Normal respiratory effort, no problems with respiration noted   Assessment and Plan:  Loretta Hartman is a 89 y.o. with history of PMB  History of postmenopausal bleeding Reviewed benign path Discussed that this is reassuring, but if she has any repeat bleeding will proceed with hysteroscopy D&C for more robust sampling of endometrium Emphasized importance that pt calls [place] for any bleeding/spotting  Return for as needed.  Lennart Pall, MD

## 2024-02-28 ENCOUNTER — Emergency Department (HOSPITAL_COMMUNITY)
Admission: EM | Admit: 2024-02-28 | Discharge: 2024-02-29 | Disposition: A | Discharge status: Home / Self Care | Payer: Medicare (Managed Care) | Attending: Emergency Medicine | Admitting: Emergency Medicine

## 2024-02-28 DIAGNOSIS — N189 Chronic kidney disease, unspecified: Secondary | ICD-10-CM | POA: Diagnosis not present

## 2024-02-28 DIAGNOSIS — R519 Headache, unspecified: Secondary | ICD-10-CM | POA: Insufficient documentation

## 2024-02-28 DIAGNOSIS — R600 Localized edema: Secondary | ICD-10-CM | POA: Diagnosis not present

## 2024-02-28 DIAGNOSIS — H9201 Otalgia, right ear: Secondary | ICD-10-CM | POA: Insufficient documentation

## 2024-02-28 DIAGNOSIS — Z79899 Other long term (current) drug therapy: Secondary | ICD-10-CM | POA: Insufficient documentation

## 2024-02-28 DIAGNOSIS — H6121 Impacted cerumen, right ear: Secondary | ICD-10-CM | POA: Diagnosis not present

## 2024-02-28 DIAGNOSIS — I509 Heart failure, unspecified: Secondary | ICD-10-CM | POA: Insufficient documentation

## 2024-02-28 DIAGNOSIS — I130 Hypertensive heart and chronic kidney disease with heart failure and stage 1 through stage 4 chronic kidney disease, or unspecified chronic kidney disease: Secondary | ICD-10-CM | POA: Diagnosis not present

## 2024-02-28 LAB — COMPREHENSIVE METABOLIC PANEL WITH GFR
ALT: 18 U/L (ref 0–44)
AST: 22 U/L (ref 15–41)
Albumin: 3.3 g/dL — ABNORMAL LOW (ref 3.5–5.0)
Alkaline Phosphatase: 67 U/L (ref 38–126)
Anion gap: 12 (ref 5–15)
BUN: 10 mg/dL (ref 8–23)
CO2: 27 mmol/L (ref 22–32)
Calcium: 8.5 mg/dL — ABNORMAL LOW (ref 8.9–10.3)
Chloride: 104 mmol/L (ref 98–111)
Creatinine, Ser: 1.22 mg/dL — ABNORMAL HIGH (ref 0.44–1.00)
GFR, Estimated: 42 mL/min — ABNORMAL LOW (ref >60)
Glucose, Bld: 106 mg/dL — ABNORMAL HIGH (ref 70–99)
Potassium: 4.1 mmol/L (ref 3.5–5.1)
Sodium: 143 mmol/L (ref 135–145)
Total Bilirubin: 0.6 mg/dL (ref 0.0–1.2)
Total Protein: 6.9 g/dL (ref 6.5–8.1)

## 2024-02-28 LAB — CBC WITH DIFFERENTIAL/PLATELET
Abs Immature Granulocytes: 0.02 10*3/uL (ref 0.00–0.07)
Basophils Absolute: 0 10*3/uL (ref 0.0–0.1)
Basophils Relative: 0 %
Eosinophils Absolute: 0 10*3/uL (ref 0.0–0.5)
Eosinophils Relative: 0 %
HCT: 39.9 % (ref 36.0–46.0)
Hemoglobin: 12.6 g/dL (ref 12.0–15.0)
Immature Granulocytes: 0 %
Lymphocytes Relative: 24 %
Lymphs Abs: 1.6 10*3/uL (ref 0.7–4.0)
MCH: 31.3 pg (ref 26.0–34.0)
MCHC: 31.6 g/dL (ref 30.0–36.0)
MCV: 99.3 fL (ref 80.0–100.0)
Monocytes Absolute: 0.7 10*3/uL (ref 0.1–1.0)
Monocytes Relative: 11 %
Neutro Abs: 4.4 10*3/uL (ref 1.7–7.7)
Neutrophils Relative %: 65 %
Platelets: 236 10*3/uL (ref 150–400)
RBC: 4.02 MIL/uL (ref 3.87–5.11)
RDW: 12.9 % (ref 11.5–15.5)
WBC: 6.9 10*3/uL (ref 4.0–10.5)
nRBC: 0 % (ref 0.0–0.2)

## 2024-02-28 LAB — RESP PANEL BY RT-PCR (RSV, FLU A&B, COVID)  RVPGX2
Influenza A by PCR: NEGATIVE
Influenza B by PCR: NEGATIVE
Resp Syncytial Virus by PCR: NEGATIVE
SARS Coronavirus 2 by RT PCR: NEGATIVE

## 2024-02-28 LAB — GROUP A STREP BY PCR: Group A Strep by PCR: NOT DETECTED

## 2024-02-28 MED ORDER — ACETAMINOPHEN 325 MG PO TABS
650.0000 mg | ORAL_TABLET | Freq: Once | ORAL | Status: AC
  Administered 2024-02-29: 650 mg via ORAL
  Filled 2024-02-28: qty 2

## 2024-02-28 NOTE — ED Provider Triage Note (Signed)
 Emergency Medicine Provider Triage Evaluation Note  Loretta Hartman , a 89 y.o. female  was evaluated in triage.  Pt complains of headache, sore throat.  She reports that she has been having a headache as well as right-sided ear pain that she notices worsens every time she tries to swallow.  Denies any sick contacts.  No recent fever, chills, body aches.  Denies any cough.  Review of Systems  Positive: As above Negative: As above  Physical Exam  BP (!) 207/83 (BP Location: Left Arm)   Pulse 73   Temp 99.9 F (37.7 C)   Resp 20   SpO2 100%  Gen:   Awake, no distress   Resp:  Normal effort  MSK:   Moves extremities without difficulty  Other:  Oropharyngeal erythema but uvula appears to be primarily still at midline.  The right tonsil is 2+ and left tonsil is 1+.  Medical Decision Making  Medically screening exam initiated at 6:44 PM.  Appropriate orders placed.  Loretta Hartman was informed that the remainder of the evaluation will be completed by another provider, this initial triage assessment does not replace that evaluation, and the importance of remaining in the ED until their evaluation is complete.     Tylon Kemmerling A, PA-C 02/28/24 1845

## 2024-02-28 NOTE — ED Triage Notes (Signed)
 Pt c/o one week of headache and R ear pain. Pt states that she's also had increased mucous production, but denies cough, fever, chills, N/V/D. Pt reports hx of HTN, compliant with meds. No vision changes or unilateral weakness noted.

## 2024-02-28 NOTE — ED Provider Notes (Signed)
 [place] EMERGENCY DEPARTMENT AT [facility] Provider Note   CSN: [id number] Arrival date & time: 02/28/24  1716     History  Chief Complaint  Patient presents with   Headache    Loretta Hartman is a 89 y.o. female.  The history is provided by the patient and medical records.  Headache Loretta Hartman is a 89 y.o. female who presents to the Emergency Department complaining of earache.  She presents to the emergency department for evaluation of right-sided earache that started last Wednesday.  Symptoms initiated with a scratchy throat and discomfort to her right ear when she swallows.  She has increased nasal drainage.  She also reports pain to the right ear and right side of her head.  No vision changes, numbness, weakness, fevers, nausea, vomiting, cough, difficulty breathing.  She has a history of hypertension, CHF, CKD.     Home Medications Prior to Admission medications   Medication Sig Start Date End Date Taking? Authorizing Provider  acetaminophen  (TYLENOL ) 325 MG tablet Take 2 tablets (650 mg total) by mouth every 6 (six) hours as needed for mild pain (or Fever >/= 101). 10/03/14   Rama, Clarine Cromer, MD  amLODipine (NORVASC) 5 MG tablet Take 5 mg by mouth daily. 06/27/16   [provider]  azithromycin  (ZITHROMAX ) 250 MG tablet Take 1 tablet (250 mg total) by mouth daily. Take first 2 tablets together, then 1 every day until finished. Patient not taking: Reported on 04/11/2023 11/17/22   Starlene Eaton, FNP  cholecalciferol (VITAMIN D3) 25 MCG (1000 UNIT) tablet Take 1,000 Units by mouth daily.    [provider]  cinacalcet (SENSIPAR) 30 MG tablet Take 30 mg by mouth daily.    [provider]  doxycycline  (VIBRAMYCIN ) 100 MG capsule Take 1 capsule (100 mg total) by mouth 2 (two) times daily. 10/01/21   Raspet, Erin K, PA-C  fluorometholone (FML) 0.1 % ophthalmic suspension Place 1 drop into both eyes daily as needed (irritation).   07/13/14   [provider]  furosemide (LASIX) 40 MG tablet Take 40 mg by mouth daily.  09/02/14   [provider]  metoprolol tartrate (LOPRESSOR) 50 MG tablet Take 50 mg by mouth 2 (two) times daily.    [provider]  Multiple Vitamin (MULTIVITAMIN WITH MINERALS) TABS tablet Take 1 tablet by mouth daily.    [provider]  Omega-3 Fatty Acids (OMEGA 3 PO) Take 1 capsule by mouth every other day.  Patient not taking: Reported on 04/11/2023    [provider]  pantoprazole  (PROTONIX ) 40 MG tablet Take 1 tablet (40 mg total) by mouth daily. Patient not taking: Reported on 04/02/2018 09/24/14   Abrol, Nayana, MD  polyethylene glycol (MIRALAX  / GLYCOLAX ) packet Take 17 g by mouth daily. Patient not taking: Reported on 04/02/2018 10/03/14   Rama, Clarine Cromer, MD  predniSONE  (DELTASONE ) 20 MG tablet 2 tabs po daily x 4 days Patient taking differently: as needed. 2 tabs po daily x 4 days 07/03/16   Albertus Hughs, DO  rosuvastatin  (CRESTOR ) 10 MG tablet Take 10 mg by mouth at bedtime.     [provider]      Allergies    Penicillins, Sulfa  antibiotics, Lisinopril , Other, and Clindamycin /lincomycin    Review of Systems   Review of Systems  Neurological:  Positive for headaches.  All other systems reviewed and are negative.   Physical Exam Updated Vital Signs BP (!) 148/78 (BP Location: Left Arm)  Pulse 60   Temp 98.9 F (37.2 C)   Resp 16   SpO2 93%  Physical Exam Vitals and nursing note reviewed.  Constitutional:      Appearance: She is well-developed.  HENT:     Head: Normocephalic and atraumatic.     Comments: Pupils equal round and reactive, EOMI.  Right TM is obscured by cerumen.  No mastoid tenderness.  No significant erythema in the posterior oropharynx.  No tenderness to palpation over the temples, maxillary sinuses    Nose: Nose normal.     Mouth/Throat:     Mouth: Mucous membranes are moist.  Cardiovascular:     Rate and  Rhythm: Normal rate and regular rhythm.     Heart sounds: No murmur heard. Pulmonary:     Effort: Pulmonary effort is normal. No respiratory distress.     Breath sounds: Normal breath sounds.  Abdominal:     Palpations: Abdomen is soft.     Tenderness: There is no abdominal tenderness. There is no guarding or rebound.  Musculoskeletal:        General: No tenderness.     Cervical back: Neck supple.     Comments: Edema to bilateral lower extremities  Skin:    General: Skin is warm and dry.  Neurological:     Mental Status: She is alert and oriented to person, place, and time.     Comments: No asymmetry of facial movements.  Visual fields grossly intact.  5 out of 5 strength in all 4 extremities  Psychiatric:        Behavior: Behavior normal.     ED Results / Procedures / Treatments   Labs (all labs ordered are listed, but only abnormal results are displayed) Labs Reviewed  COMPREHENSIVE METABOLIC PANEL WITH GFR - Abnormal; Notable for the following components:      Result Value   Glucose, Bld 106 (*)    Creatinine, Ser 1.22 (*)    Calcium  8.5 (*)    Albumin 3.3 (*)    GFR, Estimated 42 (*)    All other components within normal limits  GROUP A STREP BY PCR  RESP PANEL BY RT-PCR (RSV, FLU A&B, COVID)  RVPGX2  CBC WITH DIFFERENTIAL/PLATELET    EKG None  Radiology No results found.  Procedures Procedures    Medications Ordered in ED Medications  acetaminophen  (TYLENOL ) tablet 650 mg (650 mg Oral Given 02/29/24 0127)    ED Course/ Medical Decision Making/ A&P                                 Medical Decision Making Risk OTC drugs.   Patient with history of hypertension, CKD here for evaluation of right-sided earache and headache.  She does have a right-sided cerumen impaction.  This was removed after nursing irrigation.  She had resolution of her symptoms after removal of her cerumen.  Current picture is not consistent with CVA, otitis media, subarachnoid  hemorrhage, temporal arteritis.  Labs are near baseline.  Discussed with patient home care for cerumen impaction as well as headache if it were to recur.  Discussed outpatient follow-up as well as return precautions.        Final Clinical Impression(s) / ED Diagnoses Final diagnoses:  Impacted cerumen of right ear  Bad headache    Rx / DC Orders ED Discharge Orders     None         Monique Ano,  Nellie Banas, MD 02/29/24 1478

## 2024-02-29 NOTE — ED Notes (Signed)
 Peroxide was put in ear and at for 15 min, then ear was flushed with warm water x 3 times.

## 2024-03-01 ENCOUNTER — Emergency Department (HOSPITAL_COMMUNITY)
Admission: EM | Admit: 2024-03-01 | Discharge: 2024-03-01 | Disposition: A | Discharge status: Home / Self Care | Payer: Medicare (Managed Care) | Attending: Emergency Medicine | Admitting: Emergency Medicine

## 2024-03-01 ENCOUNTER — Emergency Department (HOSPITAL_COMMUNITY): Payer: Medicare (Managed Care)

## 2024-03-01 DIAGNOSIS — D72829 Elevated white blood cell count, unspecified: Secondary | ICD-10-CM | POA: Insufficient documentation

## 2024-03-01 DIAGNOSIS — J36 Peritonsillar abscess: Secondary | ICD-10-CM | POA: Diagnosis not present

## 2024-03-01 DIAGNOSIS — N183 Chronic kidney disease, stage 3 unspecified: Secondary | ICD-10-CM | POA: Diagnosis not present

## 2024-03-01 DIAGNOSIS — Z79899 Other long term (current) drug therapy: Secondary | ICD-10-CM | POA: Diagnosis not present

## 2024-03-01 DIAGNOSIS — I129 Hypertensive chronic kidney disease with stage 1 through stage 4 chronic kidney disease, or unspecified chronic kidney disease: Secondary | ICD-10-CM | POA: Insufficient documentation

## 2024-03-01 DIAGNOSIS — J029 Acute pharyngitis, unspecified: Secondary | ICD-10-CM | POA: Diagnosis present

## 2024-03-01 LAB — CBC WITH DIFFERENTIAL/PLATELET
Abs Immature Granulocytes: 0.04 10*3/uL (ref 0.00–0.07)
Basophils Absolute: 0 10*3/uL (ref 0.0–0.1)
Basophils Relative: 0 %
Eosinophils Absolute: 0 10*3/uL (ref 0.0–0.5)
Eosinophils Relative: 0 %
HCT: 42.1 % (ref 36.0–46.0)
Hemoglobin: 13.3 g/dL (ref 12.0–15.0)
Immature Granulocytes: 0 %
Lymphocytes Relative: 13 %
Lymphs Abs: 1.3 10*3/uL (ref 0.7–4.0)
MCH: 31.8 pg (ref 26.0–34.0)
MCHC: 31.6 g/dL (ref 30.0–36.0)
MCV: 100.7 fL — ABNORMAL HIGH (ref 80.0–100.0)
Monocytes Absolute: 0.9 10*3/uL (ref 0.1–1.0)
Monocytes Relative: 10 %
Neutro Abs: 7.3 10*3/uL (ref 1.7–7.7)
Neutrophils Relative %: 77 %
Platelets: 250 10*3/uL (ref 150–400)
RBC: 4.18 MIL/uL (ref 3.87–5.11)
RDW: 13.1 % (ref 11.5–15.5)
WBC: 9.6 10*3/uL (ref 4.0–10.5)
nRBC: 0 % (ref 0.0–0.2)

## 2024-03-01 LAB — BASIC METABOLIC PANEL WITH GFR
Anion gap: 12 (ref 5–15)
BUN: 17 mg/dL (ref 8–23)
CO2: 29 mmol/L (ref 22–32)
Calcium: 8.5 mg/dL — ABNORMAL LOW (ref 8.9–10.3)
Chloride: 103 mmol/L (ref 98–111)
Creatinine, Ser: 1.2 mg/dL — ABNORMAL HIGH (ref 0.44–1.00)
GFR, Estimated: 43 mL/min — ABNORMAL LOW (ref >60)
Glucose, Bld: 118 mg/dL — ABNORMAL HIGH (ref 70–99)
Potassium: 3.4 mmol/L — ABNORMAL LOW (ref 3.5–5.1)
Sodium: 144 mmol/L (ref 135–145)

## 2024-03-01 LAB — RESP PANEL BY RT-PCR (RSV, FLU A&B, COVID)  RVPGX2
Influenza A by PCR: NEGATIVE
Influenza B by PCR: NEGATIVE
Resp Syncytial Virus by PCR: NEGATIVE
SARS Coronavirus 2 by RT PCR: NEGATIVE

## 2024-03-01 LAB — GROUP A STREP BY PCR: Group A Strep by PCR: NOT DETECTED

## 2024-03-01 MED ORDER — DEXAMETHASONE SODIUM PHOSPHATE 10 MG/ML IJ SOLN
10.0000 mg | Freq: Once | INTRAMUSCULAR | Status: AC
  Administered 2024-03-01: 10 mg via INTRAVENOUS
  Filled 2024-03-01: qty 1

## 2024-03-01 MED ORDER — PREDNISONE 20 MG PO TABS: 40.0000 mg | ORAL_TABLET | Freq: Every day | ORAL | 0 refills | Status: AC

## 2024-03-01 MED ORDER — IOHEXOL 300 MG/ML  SOLN
75.0000 mL | Freq: Once | INTRAMUSCULAR | Status: AC | PRN
  Administered 2024-03-01: 75 mL via INTRAVENOUS

## 2024-03-01 MED ORDER — METRONIDAZOLE 500 MG PO TABS: 500.0000 mg | ORAL_TABLET | Freq: Four times a day (QID) | ORAL | 0 refills | Status: AC

## 2024-03-01 MED ORDER — LIDOCAINE VISCOUS HCL 2 % MT SOLN
15.0000 mL | Freq: Once | OROMUCOSAL | Status: AC
  Administered 2024-03-01: 15 mL via OROMUCOSAL
  Filled 2024-03-01: qty 15

## 2024-03-01 MED ORDER — LINEZOLID 600 MG PO TABS: 600.0000 mg | ORAL_TABLET | Freq: Two times a day (BID) | ORAL | 0 refills | Status: AC

## 2024-03-01 NOTE — ED Triage Notes (Signed)
 Patient complains of head congestion with drainage. Headache, right ear pain, and sore throat from drainage. Seen at [facility] ED on 05.01.2025 and was diagnosed with impacted ear wax. All test negative for COVID, Strep and Flu. Patient states ear pain and drainage is not any better. Patient denies any chest congestion or shortness of breath. Denies productive cough.

## 2024-03-01 NOTE — Discharge Instructions (Addendum)
 Have sent 2 antibiotics to your pharmacy.  Please take as prescribed.  Is important not to drink alcohol at all when taking these medicines because they have an interaction.  If you have any worsening symptoms, shortness of breath difficulty swallowing or difficulty talking you need to come back to emergency room immediately otherwise follow-up with ENT office.  Your scan shows a abnormality to your right tonsil.  It appears to have some surrounding infection so we will treat it with antibiotic.  You will need to have this continually monitored with ENT so please make sure you follow-up.

## 2024-03-01 NOTE — ED Provider Notes (Signed)
 I provided a substantive portion of the care of this patient.  I personally made/approved the management plan for this patient and take responsibility for the patient management.      89 year old female who presents with scratchy throat for about a week.  On exam patient appears to have a peritonsillar abscess.  Will confirm by CT scan and then consult ENT   Lind Repine, MD 03/01/24 1039

## 2024-03-01 NOTE — ED Provider Notes (Signed)
 [place] EMERGENCY DEPARTMENT AT [facility] Provider Note   CSN: [id number] Arrival date & time: 03/01/24  6440     History  No chief complaint on file.   Loretta Hartman is a 89 y.o. female.  With past medical history of hypertension, hyperlipidemia, stage III kidney disease presenting to emergency room with complaint of nasal congestion and right sided ear pain, sore throat. Has been ongoing for 4 days.  Patient reports it is very painful to swallow and she feels like the back of her throat is swollen.  She was seen here 2 days ago for similar complaint.  She denies fever, chest pain, cough, shortness of breath.  She is not currently on antibiotic.  No known sick contact.   HPI     Home Medications Prior to Admission medications   Medication Sig Start Date End Date Taking? Authorizing Provider  acetaminophen  (TYLENOL ) 325 MG tablet Take 2 tablets (650 mg total) by mouth every 6 (six) hours as needed for mild pain (or Fever >/= 101). 10/03/14   Rama, Clarine Cromer, MD  amLODipine (NORVASC) 5 MG tablet Take 5 mg by mouth daily. 06/27/16   [provider]  azithromycin  (ZITHROMAX ) 250 MG tablet Take 1 tablet (250 mg total) by mouth daily. Take first 2 tablets together, then 1 every day until finished. Patient not taking: Reported on 04/11/2023 11/17/22   Starlene Eaton, FNP  cholecalciferol (VITAMIN D3) 25 MCG (1000 UNIT) tablet Take 1,000 Units by mouth daily.    [provider]  cinacalcet (SENSIPAR) 30 MG tablet Take 30 mg by mouth daily.    [provider]  doxycycline  (VIBRAMYCIN ) 100 MG capsule Take 1 capsule (100 mg total) by mouth 2 (two) times daily. 10/01/21   Raspet, Erin K, PA-C  fluorometholone (FML) 0.1 % ophthalmic suspension Place 1 drop into both eyes daily as needed (irritation).  07/13/14   [provider]  furosemide (LASIX) 40 MG tablet Take 40 mg by mouth daily.  09/02/14   [provider]  metoprolol tartrate  (LOPRESSOR) 50 MG tablet Take 50 mg by mouth 2 (two) times daily.    [provider]  Multiple Vitamin (MULTIVITAMIN WITH MINERALS) TABS tablet Take 1 tablet by mouth daily.    [provider]  Omega-3 Fatty Acids (OMEGA 3 PO) Take 1 capsule by mouth every other day.  Patient not taking: Reported on 04/11/2023    [provider]  pantoprazole  (PROTONIX ) 40 MG tablet Take 1 tablet (40 mg total) by mouth daily. Patient not taking: Reported on 04/02/2018 09/24/14   Abrol, Nayana, MD  polyethylene glycol (MIRALAX  / GLYCOLAX ) packet Take 17 g by mouth daily. Patient not taking: Reported on 04/02/2018 10/03/14   Rama, Clarine Cromer, MD  predniSONE  (DELTASONE ) 20 MG tablet 2 tabs po daily x 4 days Patient taking differently: as needed. 2 tabs po daily x 4 days 07/03/16   Albertus Hughs, DO  rosuvastatin  (CRESTOR ) 10 MG tablet Take 10 mg by mouth at bedtime.     [provider]      Allergies    Penicillins, Sulfa  antibiotics, Lisinopril , Other, and Clindamycin /lincomycin    Review of Systems   Review of Systems  HENT:  Positive for congestion, ear pain and sore throat.     Physical Exam Updated Vital Signs BP (!) 182/66 (BP Location: Left Arm)   Pulse 69   Temp 99 F (37.2 C) (Oral)   Resp 17   SpO2 97%  Physical Exam Vitals and nursing note reviewed.  Constitutional:      General: She is not in acute distress.    Appearance: She is not toxic-appearing.  HENT:     Head: Normocephalic and atraumatic.     Mouth/Throat:     Comments: No cerumen impaction.  TMs within normal limits bilaterally. Eyes:     General: No scleral icterus.    Conjunctiva/sclera: Conjunctivae normal.     Comments: Right sided tonsillar swelling.  No exudate.  Cardiovascular:     Rate and Rhythm: Normal rate and regular rhythm.     Pulses: Normal pulses.     Heart sounds: Normal heart sounds.  Pulmonary:     Effort: Pulmonary effort is normal. No respiratory distress.     Breath  sounds: Normal breath sounds.  Abdominal:     General: Abdomen is flat. Bowel sounds are normal.     Palpations: Abdomen is soft.     Tenderness: There is no abdominal tenderness.  Skin:    General: Skin is warm and dry.     Findings: No lesion.  Neurological:     General: No focal deficit present.     Mental Status: She is alert and oriented to person, place, and time. Mental status is at baseline.     ED Results / Procedures / Treatments   Labs (all labs ordered are listed, but only abnormal results are displayed) Labs Reviewed  CBC WITH DIFFERENTIAL/PLATELET - Abnormal; Notable for the following components:      Result Value   MCV 100.7 (*)    All other components within normal limits  BASIC METABOLIC PANEL WITH GFR - Abnormal; Notable for the following components:   Potassium 3.4 (*)    Glucose, Bld 118 (*)    Creatinine, Ser 1.20 (*)    Calcium  8.5 (*)    GFR, Estimated 43 (*)    All other components within normal limits  GROUP A STREP BY PCR  RESP PANEL BY RT-PCR (RSV, FLU A&B, COVID)  RVPGX2    EKG None  Radiology CT Soft Tissue Neck W Contrast Result Date: 03/01/2024 CLINICAL DATA:  89 year old female with congestion, drainage, headache, right ear pain, sore throat. Not improving. EXAM: CT NECK WITH CONTRAST TECHNIQUE: Multidetector CT imaging of the neck was performed using the standard protocol following the bolus administration of intravenous contrast. RADIATION DOSE REDUCTION: This exam was performed according to the departmental dose-optimization program which includes automated exposure control, adjustment of the mA and/or kV according to patient size and/or use of iterative reconstruction technique. CONTRAST:  75mL OMNIPAQUE IOHEXOL 300 MG/ML  SOLN COMPARISON:  Chest CT 11/22/2004. FINDINGS: Pharynx and larynx: Large, heterogeneously enhancing centrally cystic or necrotic mass of the right oropharynx with epicenter at the level of the right palatine tonsil (series  4, image 44). This encompasses 33 x 29 by 40 mm with conspicuous peripheral hyperenhancement although some indistinct margins. The c-shaped internal fluid density component is nearly 3 cm. Notably, the adjacent right parapharyngeal space does not appear inflamed on series 4, image 44. And no retropharyngeal effusion is identified. However, there does appear to be parapharyngeal soft tissue stranding caudal to the lesion. Contralateral left parapharyngeal spaces negative. The lesion is inseparable from the soft palate as well, uvula is deviated to the left. Vallecula, larynx and epiglottis are spared.  Nasopharynx is spared. Salivary glands: Asymmetric left sublingual gland, probably reflecting some atrophy on the right side. Sublingual space appears negative. Submandibular glands and parotid  glands appear within normal limits. Thyroid : Negative. Lymph nodes: Small but asymmetric and hyperenhancing right level 2 lymph nodes measure up to 8 mm short axis such as on series 4, image 56. Small hyperenhancing right retropharyngeal node on image 41. No other cervical lymphadenopathy. Vascular: Tortuous major arteries in the neck, from the aortic arch. Major vascular structures in the bilateral neck and at the skull base appear patent. Highly tortuous carotid arteries. Limited intracranial: Calcified atherosclerosis at the skull base. Negative visible brain parenchyma. Visualized orbits: Postoperative changes to the globes, negative orbits. Mastoids and visualized paranasal sinuses: Bilateral tympanic cavities, mastoids, paranasal sinuses are clear. Skeleton: Absent maxillary dentition. Generalized osteopenia. Cervical spine degeneration. No acute or suspicious osseous lesion identified. Upper chest: Emphysema. Tortuous great vessels. Calcified aortic atherosclerosis. No superior mediastinal lymphadenopathy. Asymmetric mild right upper lung atelectasis and/or scarring. No visible axillary lymphadenopathy. IMPRESSION: 1.  Large, lobulated, peripherally hyper-enhancing and centrally cystic or necrotic 4 cm mass of the Right oropharynx at the level of the palatine tonsil, soft palate. This is indeterminate for Necrotic Carcinoma versus a large Tonsillar Abscess (subcentimeter solid and enhancing ipsilateral retropharyngeal and level 2 lymph nodes favoring the latter, but absent right parapharyngeal space inflammation favoring the former). Recommend ENT consultation. 2. No other acute or suspicious finding in the neck. Highly tortuous great vessels. Aortic Atherosclerosis (ICD10-I70.0). Emphysema (ICD10-J43.9). Electronically Signed   By: Marlise Simpers M.D.   On: 03/01/2024 11:12    Procedures Procedures    Medications Ordered in ED Medications - No data to display  ED Course/ Medical Decision Making/ A&P Clinical Course as of 03/01/24 1237  Sat Mar 01, 2024  1141 Dr [facility] ENT recommending [place] outpatient, but will check with pharmacy prior to giving this. There office will call to f/u apt.  [JB]  1200 Will listed allergy recommending 600mg  bid zyvox w/ flagyl 500mg  qid [JB]    Clinical Course User Index [JB] Allicia Culley, Kandace Organ, PA-C                                 Medical Decision Making Amount and/or Complexity of Data Reviewed Labs: ordered. Radiology: ordered.  Risk Prescription drug management.   Chabely Jodie Munson 89 y.o. presented today for URI like symptoms. Working DDx that I considered at this time includes, but not limited to, viral illness, pharyngitis, mono, sinusitis, electrolyte abnormality, AOM.  R/o DDx: these additional diagnoses are not consistent with patient's history, presentation, physical exam, labs/imaging findings.  Review of prior external notes: Due to ER visit from 02/28/2024 in which patient was treated for cerumen impaction.  Did not have any tonsillar swelling or exudates at that time.  Had negative flu and COVID at that time.  Labs:  Respiratory Panel: Neg Group A Strep:  neg CBC without leukocytosis.  Kidney function at baseline.  Imaging:  4 Centimeter tonsillar mass concerning for carcinoma versus peritonsillar abscess  Consult to  ENT discussing presentation, labs and imaging.  They recommend trial of oral antibiotics. Do not feel patient is good candidate for drainage at this time d/t concern of mass.  And they will call to schedule follow-up in 1 to 2 days.  Will consider biopsy versus drainage depending on response.  Problem List / ED Course / Critical interventions / Medication management  Reporting to emergency room with complaint of sore throat.  This has been ongoing for 4 days.  She is handling secretions and  not having difficulty with phonation.  No shortness of breath she appears well and is hemodynamically stable protecting airway.  She is complaining of URI-like symptoms,  no cough lungs clear to auscultation not hypoxic.  On physical exam she has obvious right tonsillar enlargement and uvula is pushed towards the left side.  This is suspicious for peritonsillar abscess.  Will order labs, respiratory panel and get CT scan to rule out peritonsillar abscess.  Will give Decadron and lidocaine for symptom control. Labs overall unremarkable.  She does not have significant white count.  She does not have fever.  Continues to tolerate oral intake and is stable.  Feels she has had significant improvement since after Decadron.  ENT does not feel she is needing admission or emergent drainage for this.  Recommending oral antibiotic.  Patient family member expressed understanding and agree to plan.  They are given very strict return precautions. I ordered medication including Decadron, Xylocaine Reevaluation of the patient after these medicines showed that the patient improved Patients vitals assessed. Upon arrival patient is hemodynamically stable.  I have reviewed the patients home medicines and have made adjustments as needed     Plan:  F/u w/ PCP in 2-3d  to ensure resolution of sx.  Patient was given return precautions. Patient stable for discharge at this time.  Patient educated on sx and dx and verbalized understanding of plan. Return to ER if new or worsening sx.          Final Clinical Impression(s) / ED Diagnoses Final diagnoses:  Peritonsillar abscess    Rx / DC Orders ED Discharge Orders          Ordered    predniSONE  (DELTASONE ) 20 MG tablet  Daily        03/01/24 1205    linezolid (ZYVOX) 600 MG tablet  2 times daily        03/01/24 1205    metroNIDAZOLE (FLAGYL) 500 MG tablet  4 times daily        03/01/24 1205              Dory Demont, Kandace Organ, PA-C 03/01/24 1242    Lind Repine, MD 03/02/24 1143

## 2024-03-27 ENCOUNTER — Ambulatory Visit
Admission: RE | Admit: 2024-03-27 | Discharge: 2024-03-27 | Disposition: A | Discharge status: Home / Self Care | Payer: Medicare (Managed Care) | Source: Ambulatory Visit | Attending: Vascular Surgery | Admitting: Vascular Surgery

## 2024-03-27 ENCOUNTER — Other Ambulatory Visit: Payer: Self-pay | Admitting: Vascular Surgery

## 2024-03-27 DIAGNOSIS — J189 Pneumonia, unspecified organism: Secondary | ICD-10-CM

## 2024-04-03 NOTE — Progress Notes (Signed)
 No change
